# Patient Record
Sex: Female | Born: 1975 | Race: White | Hispanic: No | Smoking: Never smoker
Health system: Southern US, Community
[De-identification: ages and names within clinical notes are randomized; demographics above are authoritative.]

## PROBLEM LIST (undated history)

## (undated) ENCOUNTER — Inpatient Hospital Stay (HOSPITAL_COMMUNITY): Payer: Self-pay

## (undated) DIAGNOSIS — G43909 Migraine, unspecified, not intractable, without status migrainosus: Secondary | ICD-10-CM

## (undated) DIAGNOSIS — B019 Varicella without complication: Secondary | ICD-10-CM

## (undated) DIAGNOSIS — M5412 Radiculopathy, cervical region: Secondary | ICD-10-CM

## (undated) DIAGNOSIS — T7840XA Allergy, unspecified, initial encounter: Secondary | ICD-10-CM

## (undated) DIAGNOSIS — I10 Essential (primary) hypertension: Secondary | ICD-10-CM

## (undated) DIAGNOSIS — R32 Unspecified urinary incontinence: Secondary | ICD-10-CM

## (undated) DIAGNOSIS — N200 Calculus of kidney: Secondary | ICD-10-CM

## (undated) DIAGNOSIS — G629 Polyneuropathy, unspecified: Secondary | ICD-10-CM

## (undated) DIAGNOSIS — R030 Elevated blood-pressure reading, without diagnosis of hypertension: Secondary | ICD-10-CM

## (undated) HISTORY — DX: Essential (primary) hypertension: I10

## (undated) HISTORY — DX: Radiculopathy, cervical region: M54.12

## (undated) HISTORY — DX: Allergy, unspecified, initial encounter: T78.40XA

## (undated) HISTORY — DX: Varicella without complication: B01.9

## (undated) HISTORY — DX: Migraine, unspecified, not intractable, without status migrainosus: G43.909

## (undated) HISTORY — DX: Elevated blood-pressure reading, without diagnosis of hypertension: R03.0

## (undated) HISTORY — DX: Calculus of kidney: N20.0

## (undated) HISTORY — DX: Unspecified urinary incontinence: R32

## (undated) HISTORY — DX: Polyneuropathy, unspecified: G62.9

## (undated) HISTORY — PX: COLONOSCOPY: SHX174

## (undated) HISTORY — PX: WISDOM TOOTH EXTRACTION: SHX21

---
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
## 2001-05-27 ENCOUNTER — Encounter: Payer: Self-pay | Admitting: Urology

## 2001-05-27 ENCOUNTER — Ambulatory Visit (HOSPITAL_COMMUNITY): Admission: RE | Admit: 2001-05-27 | Discharge: 2001-05-27 | Payer: Self-pay | Admitting: Urology

## 2003-07-18 ENCOUNTER — Other Ambulatory Visit: Admission: RE | Admit: 2003-07-18 | Discharge: 2003-07-18 | Payer: Self-pay | Admitting: Obstetrics and Gynecology

## 2004-08-26 ENCOUNTER — Emergency Department (HOSPITAL_COMMUNITY): Admission: EM | Admit: 2004-08-26 | Discharge: 2004-08-26 | Payer: Self-pay | Admitting: Emergency Medicine

## 2005-02-10 ENCOUNTER — Emergency Department (HOSPITAL_COMMUNITY): Admission: EM | Admit: 2005-02-10 | Discharge: 2005-02-10 | Payer: Self-pay | Admitting: Emergency Medicine

## 2007-02-21 ENCOUNTER — Encounter: Admission: RE | Admit: 2007-02-21 | Discharge: 2007-02-21 | Payer: Self-pay | Admitting: Family Medicine

## 2007-03-09 ENCOUNTER — Encounter: Admission: RE | Admit: 2007-03-09 | Discharge: 2007-03-09 | Payer: Self-pay | Admitting: Family Medicine

## 2007-03-14 ENCOUNTER — Emergency Department (HOSPITAL_COMMUNITY): Admission: EM | Admit: 2007-03-14 | Discharge: 2007-03-15 | Payer: Self-pay | Admitting: Emergency Medicine

## 2008-10-11 ENCOUNTER — Emergency Department (HOSPITAL_BASED_OUTPATIENT_CLINIC_OR_DEPARTMENT_OTHER): Admission: EM | Admit: 2008-10-11 | Discharge: 2008-10-11 | Payer: Self-pay | Admitting: Emergency Medicine

## 2009-06-29 ENCOUNTER — Emergency Department (HOSPITAL_BASED_OUTPATIENT_CLINIC_OR_DEPARTMENT_OTHER): Admission: EM | Admit: 2009-06-29 | Discharge: 2009-06-29 | Payer: Self-pay | Admitting: Emergency Medicine

## 2009-06-29 ENCOUNTER — Ambulatory Visit: Payer: Self-pay | Admitting: Diagnostic Radiology

## 2009-07-13 ENCOUNTER — Emergency Department (HOSPITAL_BASED_OUTPATIENT_CLINIC_OR_DEPARTMENT_OTHER): Admission: EM | Admit: 2009-07-13 | Discharge: 2009-07-13 | Payer: Self-pay | Admitting: Emergency Medicine

## 2009-07-13 ENCOUNTER — Ambulatory Visit: Payer: Self-pay | Admitting: Diagnostic Radiology

## 2009-07-21 ENCOUNTER — Ambulatory Visit (HOSPITAL_BASED_OUTPATIENT_CLINIC_OR_DEPARTMENT_OTHER): Admission: RE | Admit: 2009-07-21 | Discharge: 2009-07-21 | Payer: Self-pay | Admitting: Urology

## 2009-08-19 HISTORY — PX: OTHER SURGICAL HISTORY: SHX169

## 2009-10-02 ENCOUNTER — Emergency Department (HOSPITAL_COMMUNITY): Admission: EM | Admit: 2009-10-02 | Discharge: 2009-10-02 | Payer: Self-pay | Admitting: Emergency Medicine
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
## 2010-08-19 HISTORY — PX: APPENDECTOMY: SHX54

## 2010-11-20 LAB — POCT PREGNANCY, URINE: Preg Test, Ur: NEGATIVE

## 2010-11-21 LAB — CBC
HCT: 36.9 % (ref 36.0–46.0)
Hemoglobin: 12.9 g/dL (ref 12.0–15.0)
MCHC: 35.1 g/dL (ref 30.0–36.0)
RDW: 11.8 % (ref 11.5–15.5)
WBC: 7.2 10*3/uL (ref 4.0–10.5)
WBC: 9.3 10*3/uL (ref 4.0–10.5)

## 2010-11-21 LAB — URINE MICROSCOPIC-ADD ON

## 2010-11-21 LAB — DIFFERENTIAL
Basophils Absolute: 0.1 10*3/uL (ref 0.0–0.1)
Basophils Absolute: 0.1 10*3/uL (ref 0.0–0.1)
Basophils Relative: 1 % (ref 0–1)
Eosinophils Absolute: 0.2 10*3/uL (ref 0.0–0.7)
Eosinophils Absolute: 0 10*3/uL (ref 0.0–0.7)
Eosinophils Relative: 0 % (ref 0–5)
Lymphocytes Relative: 18 % (ref 12–46)
Lymphs Abs: 2.1 10*3/uL (ref 0.7–4.0)
Lymphs Abs: 1.7 10*3/uL (ref 0.7–4.0)
Monocytes Absolute: 0.4 10*3/uL (ref 0.1–1.0)
Monocytes Absolute: 0.3 10*3/uL (ref 0.1–1.0)
Monocytes Relative: 6 % (ref 3–12)
Monocytes Relative: 4 % (ref 3–12)
Neutro Abs: 4.4 10*3/uL (ref 1.7–7.7)
Neutro Abs: 7.2 10*3/uL (ref 1.7–7.7)
Neutrophils Relative %: 77 % (ref 43–77)

## 2010-11-21 LAB — COMPREHENSIVE METABOLIC PANEL
ALT: 12 U/L (ref 0–35)
AST: 21 U/L (ref 0–37)
Albumin: 4 g/dL (ref 3.5–5.2)
Alkaline Phosphatase: 46 U/L (ref 39–117)
BUN: 17 mg/dL (ref 6–23)
CO2: 24 mEq/L (ref 19–32)
Calcium: 9 mg/dL (ref 8.4–10.5)
Chloride: 105 mEq/L (ref 96–112)
Creatinine, Ser: 0.9 mg/dL (ref 0.4–1.2)
GFR calc Af Amer: >60 mL/min (ref >60)
GFR calc non Af Amer: >60 mL/min (ref >60)
Glucose, Bld: 114 mg/dL — ABNORMAL HIGH (ref 70–99)
Potassium: 3.7 mEq/L (ref 3.5–5.1)
Sodium: 141 mEq/L (ref 135–145)
Total Bilirubin: 0.4 mg/dL (ref 0.3–1.2)
Total Protein: 7 g/dL (ref 6.0–8.3)

## 2010-11-21 LAB — URINALYSIS, ROUTINE W REFLEX MICROSCOPIC
Bilirubin Urine: NEGATIVE
Bilirubin Urine: NEGATIVE
Glucose, UA: NEGATIVE mg/dL
Glucose, UA: NEGATIVE mg/dL
Ketones, ur: NEGATIVE mg/dL
Ketones, ur: 15 mg/dL — AB
Leukocytes, UA: NEGATIVE
Protein, ur: NEGATIVE mg/dL
Specific Gravity, Urine: 1.02 (ref 1.005–1.030)
Specific Gravity, Urine: 1.027 (ref 1.005–1.030)
Urobilinogen, UA: 0.2 mg/dL (ref 0.0–1.0)
pH: 6 (ref 5.0–8.0)

## 2010-11-21 LAB — PREGNANCY, URINE: Preg Test, Ur: NEGATIVE

## 2010-11-21 LAB — BASIC METABOLIC PANEL
BUN: 15 mg/dL (ref 6–23)
CO2: 24 mEq/L (ref 19–32)
Creatinine, Ser: 0.8 mg/dL (ref 0.4–1.2)
GFR calc non Af Amer: >60 mL/min (ref >60)
Potassium: 3.9 mEq/L (ref 3.5–5.1)

## 2010-12-04 LAB — PREGNANCY, URINE: Preg Test, Ur: NEGATIVE

## 2010-12-14 ENCOUNTER — Other Ambulatory Visit: Payer: Self-pay | Admitting: Family Medicine

## 2010-12-14 ENCOUNTER — Emergency Department (INDEPENDENT_AMBULATORY_CARE_PROVIDER_SITE_OTHER): Payer: BC Managed Care – PPO

## 2010-12-14 ENCOUNTER — Emergency Department (HOSPITAL_BASED_OUTPATIENT_CLINIC_OR_DEPARTMENT_OTHER)
Admission: EM | Admit: 2010-12-14 | Discharge: 2010-12-15 | Disposition: A | Discharge status: Nursing Home (Includes ICF) | Payer: BC Managed Care – PPO | Source: Home / Self Care | Attending: Emergency Medicine | Admitting: Emergency Medicine

## 2010-12-14 DIAGNOSIS — R1013 Epigastric pain: Secondary | ICD-10-CM | POA: Insufficient documentation

## 2010-12-14 DIAGNOSIS — R109 Unspecified abdominal pain: Secondary | ICD-10-CM

## 2010-12-14 DIAGNOSIS — R110 Nausea: Secondary | ICD-10-CM

## 2010-12-14 DIAGNOSIS — K3580 Unspecified acute appendicitis: Secondary | ICD-10-CM | POA: Insufficient documentation

## 2010-12-14 DIAGNOSIS — Z87442 Personal history of urinary calculi: Secondary | ICD-10-CM

## 2010-12-14 LAB — DIFFERENTIAL
Eosinophils Relative: 0 % (ref 0–5)
Lymphs Abs: 1.5 10*3/uL (ref 0.7–4.0)
Monocytes Relative: 5 % (ref 3–12)
Neutrophils Relative %: 83 % — ABNORMAL HIGH (ref 43–77)

## 2010-12-14 LAB — COMPREHENSIVE METABOLIC PANEL
CO2: 25 mEq/L (ref 19–32)
Calcium: 9.2 mg/dL (ref 8.4–10.5)
Creatinine, Ser: 0.5 mg/dL (ref 0.4–1.2)
GFR calc non Af Amer: >60 mL/min (ref >60)
Glucose, Bld: 86 mg/dL (ref 70–99)
Total Protein: 8.1 g/dL (ref 6.0–8.3)

## 2010-12-14 LAB — CBC
MCHC: 34.4 g/dL (ref 30.0–36.0)
RBC: 4.23 MIL/uL (ref 3.87–5.11)
WBC: 13.4 10*3/uL — ABNORMAL HIGH (ref 4.0–10.5)

## 2010-12-14 LAB — URINALYSIS, ROUTINE W REFLEX MICROSCOPIC
Bilirubin Urine: NEGATIVE
Glucose, UA: NEGATIVE mg/dL
Hgb urine dipstick: NEGATIVE
Ketones, ur: 15 mg/dL — AB
Nitrite: NEGATIVE
Protein, ur: NEGATIVE mg/dL
Specific Gravity, Urine: 1.02 (ref 1.005–1.030)

## 2010-12-14 LAB — LIPASE, BLOOD: Lipase: 94 U/L (ref 23–300)

## 2010-12-14 MED ORDER — IOHEXOL 300 MG/ML  SOLN
100.0000 mL | Freq: Once | INTRAMUSCULAR | Status: AC | PRN
  Administered 2010-12-14: 100 mL via INTRAVENOUS

## 2010-12-15 ENCOUNTER — Ambulatory Visit (HOSPITAL_COMMUNITY)
Admission: EM | Admit: 2010-12-15 | Discharge: 2010-12-16 | Disposition: A | Discharge status: Home / Self Care | Payer: BC Managed Care – PPO | Attending: General Surgery | Admitting: General Surgery

## 2010-12-15 ENCOUNTER — Other Ambulatory Visit: Payer: Self-pay | Admitting: General Surgery

## 2010-12-15 DIAGNOSIS — R1031 Right lower quadrant pain: Secondary | ICD-10-CM | POA: Insufficient documentation

## 2010-12-15 DIAGNOSIS — K3580 Unspecified acute appendicitis: Secondary | ICD-10-CM | POA: Insufficient documentation

## 2010-12-15 DIAGNOSIS — R110 Nausea: Secondary | ICD-10-CM | POA: Insufficient documentation

## 2010-12-15 DIAGNOSIS — R1013 Epigastric pain: Secondary | ICD-10-CM | POA: Insufficient documentation

## 2010-12-15 DIAGNOSIS — Z87442 Personal history of urinary calculi: Secondary | ICD-10-CM | POA: Insufficient documentation

## 2010-12-17 ENCOUNTER — Other Ambulatory Visit: Payer: Self-pay

## 2010-12-19 NOTE — Op Note (Signed)
NAMEDOTTI, BUSEY            ACCOUNT NO.:  [id number]  MEDICAL RECORD NO.:  [id number]           PATIENT TYPE:  E  LOCATION:  WLED                         FACILITY:  [facility]  PHYSICIAN:  Sharlet Salina T. Finola Rosal, M.D.DATE OF BIRTH:  1976/03/21  DATE OF PROCEDURE: DATE OF DISCHARGE:  12/15/2010                              OPERATIVE REPORT   PREOPERATIVE DIAGNOSIS:  Acute appendicitis.  POSTOPERATIVE DIAGNOSIS:  Acute appendicitis.  SURGICAL PROCEDURE:  Laparoscopic appendectomy.  SURGEON:  Lorne Skeens. Reilly Molchan, M.D.  ANESTHESIA:  General.  BRIEF HISTORY:  Ms. Edmunds is a 35 year old female who presents with about 18 hours of persistent abdominal pain, initially upper abdomen and radiating somewhat towards the lower abdomen.  She has had a workup in the emergency room including a negative ultrasound of the gallbladder and a CT scan showing probable acute appendicitis.  She has elevated white count, nausea, and localized tenderness in the right lower quadrant.  I have recommended proceeding with laparoscopic appendectomy for probable acute appendicitis.  Procedure indications; risks of bleeding, infection, anesthetic complications were discussed and understood.  Following a broad-spectrum antibiotics, she was taken to the operating room for procedure.  DESCRIPTION OF OPERATION:  The patient was brought to the operating room, placed in a supine position on the operating table and general endotracheal anesthesia was induced.  Foley catheter was placed.  The abdomen was widely and sterilely prepped and draped and correct patient procedure was verified.  Local anesthesia was used to infiltrate the trocar sites.  A 1-cm incision was made in the umbilicus and dissection was carried down in the midline fascia, which was incised about 1 cm and the peritoneum entered under direct vision.  Through a mattress suture of 0 Vicryl, the Hasson trocar was placed and  pneumoperitoneum established.  Under direct vision, a 5-mm trocar was placed in the right upper quadrant and 12-mm trocar in the left lower quadrant.  Exploration showed no evidence of peritonitis or free fluid.  The liver and gallbladder appeared normal.  Intestinal loops appeared normal.  The appendix was found lying near the midline and was acutely inflamed without perforation or gangrene.  The appendix was elevated.  The mesoappendix was exposed.  It was sequentially divided with a Harmonic scalpel until the appendix was completely freed down to the tip of the cecum.  The appendix was divided at the tip of the cecum with a single firing of the 45-mm linear stapler blue load.  The staple line was intact and without bleeding.  There was no inflammation on the staple line.  The appendix was placed in an EndoCatch bag and brought out through the umbilicus.  It was then thoroughly irrigated and complete hemostasis assured.  There was no bleeding or evidence of trocar injury.  Also noted, there were normal uterus, ovaries, and tubes.  The mattress suture was secured at the umbilicus, and all CO2 evacuated and trocars removed.  Skin incisions were closed with subcuticular Monocryl and Dermabond.  Sponges and needle counts were correct.  The patient was taken to the Recovery in good condition.     Lorne Skeens. Aden Youngman, M.D.  BTH/MEDQ  D:  12/15/2010  T:  12/15/2010  Job:  [id number]  Electronically Signed by Glenna Fellows M.D. on 12/19/2010 08:16:47 AM

## 2010-12-19 NOTE — H&P (Signed)
Kristina Pierce, Kristina Pierce            ACCOUNT NO.:  [id number]  MEDICAL RECORD NO.:  [id number]           PATIENT TYPE:  E  LOCATION:  WLED                         FACILITY:  [facility]  PHYSICIAN:  Sharlet Salina T. Delmy Holdren, M.D.DATE OF BIRTH:  11-Apr-1976  DATE OF ADMISSION:  12/15/2010 DATE OF DISCHARGE:  12/15/2010                             HISTORY & PHYSICAL   CHIEF COMPLAINT:  Abdominal pain.  HISTORY:  This patient is a 35 year old female who woke early yesterday morning about 20 hours ago with epigastric abdominal pain.  She had some nausea.  She was able to go to work, but then on the way home, pain worsened and she saw her primary care physician.  There was concern of gallbladder, based on history.  Plans were made for ultrasound on Monday.  However, this evening, her pain worsened and she presented to [facility].  Their, an ultrasound was obtained of the gallbladder and liver that was normal.  Following this, a CT scan was obtained indicating early appendicitis as described below.  The patient has noted her pain has moved gradually towards the lower abdomen and is somewhat on the right.  She has had nausea without vomiting.  No fever or chills.  She has no chronic GI complaints.  Bowel movement normal. No urinary symptoms.  PAST MEDICAL HISTORY:  She has had cystoscopy for kidney stones and tonsillectomy, otherwise well.  MEDICATIONS:  None.  ALLERGIES:  None.  SOCIAL HISTORY:  Married, employed as a Runner, broadcasting/film/video.  No cigarettes or alcohol.  FAMILY HISTORY:  Noncontributory.  REVIEW OF SYSTEMS:  Very healthy, all negative.  PHYSICAL EXAMINATION:  VITAL SIGNS:  Temperature is 98.6, heart rate 78, blood pressure 115/68, respirations 16, O2 sat 100%. GENERAL:  She is a somewhat uncomfortable appearing white female in no acute stress. SKIN:  Warm, dry.  No rash or infection. HEENT:  No mass or thyromegaly.  Sclerae nonicteric.  Oropharynx clear. LYMPH NODES:  No  cervical, supraclavicular, or inguinal nodes palpable. LUNGS:  Clear without wheezing or increased work of breathing. CARDIAC:  Regular rate and rhythm.  No murmurs. ABDOMEN:  Bowel sounds are present.  There is mild diffuse tenderness, but well localized significant tenderness in the right lower quadrant with guarding, but no definite peritoneal signs.  No discernible masses or organomegaly.  EXTREMITIES:  No joint swelling or deformity. NEUROLOGIC:  Alert, oriented, motor and sensory exam is grossly normal.  LABORATORY DATA AND X-RAY:  White count elevated to 13.4 thousand, hemoglobin 13.4.  Electrolytes normal.  Urinalysis unremarkable.  CT scan of the abdomen and pelvis were reviewed.  This shows a mildly dilated appendix, which is not completely visualized with some increased enhancement of concern for early acute appendicitis.  ASSESSMENT/PLAN:  The patient's presentation is a little unusual, but she has marked tenderness in the right lower quadrant, elevated white count, nausea, and CT scan indicating possible appendicitis.  I have recommended proceeding with laparoscopic appendectomy.  She is receiving broad-spectrum antibiotics preoperatively.     Lorne Skeens. Idaly Verret, M.D.     Tory Emerald  D:  12/15/2010  T:  12/15/2010  Job:  [id number]  Electronically Signed by Glenna Fellows M.D. on 12/19/2010 08:16:41 AM

## 2011-06-03 LAB — I-STAT 8, (EC8 V) (CONVERTED LAB)
Acid-base deficit: 1
Chloride: 105
Glucose, Bld: 95
Hemoglobin: 13.3
Potassium: 3.7
Sodium: 138
TCO2: 25

## 2011-06-03 LAB — CBC
HCT: 36.7
Hemoglobin: 12.6
MCHC: 34.3
MCV: 94.1
RDW: 12.5

## 2011-06-03 LAB — POCT CARDIAC MARKERS: CKMB, poc: <1 — ABNORMAL LOW

## 2011-06-03 LAB — URINALYSIS, ROUTINE W REFLEX MICROSCOPIC
Nitrite: NEGATIVE
Specific Gravity, Urine: 1.022
Urobilinogen, UA: 0.2
pH: 7.5

## 2011-06-03 LAB — DIFFERENTIAL
Basophils Absolute: 0
Eosinophils Absolute: 0.1
Eosinophils Relative: 1
Lymphocytes Relative: 33
Monocytes Absolute: 0.3

## 2011-06-03 LAB — URINE MICROSCOPIC-ADD ON

## 2011-06-03 LAB — POCT PREGNANCY, URINE: Preg Test, Ur: NEGATIVE

## 2011-07-27 ENCOUNTER — Encounter: Payer: Self-pay | Admitting: Emergency Medicine

## 2011-07-27 ENCOUNTER — Emergency Department (HOSPITAL_BASED_OUTPATIENT_CLINIC_OR_DEPARTMENT_OTHER)
Admission: EM | Admit: 2011-07-27 | Discharge: 2011-07-27 | Disposition: A | Discharge status: Home / Self Care | Payer: BC Managed Care – PPO | Attending: Emergency Medicine | Admitting: Emergency Medicine

## 2011-07-27 DIAGNOSIS — M549 Dorsalgia, unspecified: Secondary | ICD-10-CM | POA: Insufficient documentation

## 2011-07-27 DIAGNOSIS — T148XXA Other injury of unspecified body region, initial encounter: Secondary | ICD-10-CM

## 2011-07-27 DIAGNOSIS — M542 Cervicalgia: Secondary | ICD-10-CM | POA: Insufficient documentation

## 2011-07-27 DIAGNOSIS — O2690 Pregnancy related conditions, unspecified, unspecified trimester: Secondary | ICD-10-CM | POA: Insufficient documentation

## 2011-07-27 MED ORDER — HYDROCODONE-ACETAMINOPHEN 5-325 MG PO TABS: 1.0000 | ORAL_TABLET | ORAL | Status: AC | PRN

## 2011-07-27 MED ORDER — HYDROCODONE-ACETAMINOPHEN 5-325 MG PO TABS
1.0000 | ORAL_TABLET | Freq: Once | ORAL | Status: AC
  Administered 2011-07-27: 1 via ORAL
  Filled 2011-07-27: qty 1

## 2011-07-27 NOTE — ED Provider Notes (Signed)
History     CSN: [id number] Arrival date & time: 07/27/2011  6:57 AM   First MD Initiated Contact with Patient 07/27/11 0710      Chief Complaint  Patient presents with  . Back Pain    pt reports chronic low back pain unrelieved by tylenol at home    (Consider location/radiation/quality/duration/timing/severity/associated sxs/prior treatment) HPI Comments: Patient presents with right upper back pain that radiates up her neck at times.  No specific injury.  It began on Thursday morning at approximately 4 AM.  She notes she has had this similar pain before and has been to physical therapy for approximately 4 years ago.  No prior surgeries.  She has been using Tylenol at home because she is newly pregnant at approximately 6 weeks.  She is continued to use heat and ice as well but this and unable to sleep much over the last few days due to her pain.  She does have a followup appointment with her orthopedic physician on Tuesday but is trying to get some pain relief social be able to sleep between now and then.  Patient denies any fevers, cough, shortness of breath.  She has no low back pain despite what is present in the nursing notes.  Patient is a 35 y.o. female presenting with back pain. The history is provided by the patient. No language interpreter was used.  Back Pain  This is a recurrent problem. The current episode started more than 2 days ago. The problem occurs constantly. The problem has been gradually worsening. The pain is associated with no known injury. The pain is present in the thoracic spine. The quality of the pain is described as shooting. The pain is moderate. The symptoms are aggravated by certain positions. Pertinent negatives include no chest pain, no fever, no numbness, no weight loss, no headaches, no abdominal pain, no abdominal swelling, no bowel incontinence, no perianal numbness, no bladder incontinence, no dysuria, no pelvic pain, no leg pain, no paresthesias, no paresis,  no tingling and no weakness. She has tried ice, bed rest and heat for the symptoms. The treatment provided mild relief.    Past Medical History  Diagnosis Date  . Kidney stone     Past Surgical History  Procedure Date  . Appendectomy     History reviewed. No pertinent family history.  History  Substance Use Topics  . Smoking status: Never Smoker   . Smokeless tobacco: Not on file  . Alcohol Use: No    OB History    Grav Para Term Preterm Abortions TAB SAB Ect Mult Living   1               Review of Systems  Constitutional: Negative.  Negative for fever, chills and weight loss.  HENT: Negative.   Eyes: Negative.  Negative for discharge and redness.  Respiratory: Negative.  Negative for cough and shortness of breath.   Cardiovascular: Negative.  Negative for chest pain.  Gastrointestinal: Negative.  Negative for nausea, vomiting, abdominal pain, diarrhea and bowel incontinence.  Genitourinary: Negative.  Negative for bladder incontinence, dysuria, vaginal discharge and pelvic pain.  Musculoskeletal: Positive for back pain.  Skin: Negative.  Negative for color change and rash.  Neurological: Negative.  Negative for tingling, syncope, weakness, numbness, headaches and paresthesias.  Hematological: Negative.  Negative for adenopathy.  Psychiatric/Behavioral: Negative.  Negative for confusion.  All other systems reviewed and are negative.    Allergies  Review of patient's allergies indicates no known allergies.  Home Medications   Current Outpatient Rx  Name Route Sig Dispense Refill  . PRENATAL 27-0.8 MG PO TABS Oral Take 1 tablet by mouth daily.        BP 115/60  Pulse 99  Temp 98 F (36.7 C)  Resp 22  Wt 138 lb (62.596 kg)  SpO2 100%  LMP 06/15/2011  Physical Exam  Constitutional: She is oriented to person, place, and time. She appears well-developed and well-nourished.  Non-toxic appearance. She does not have a sickly appearance.  HENT:  Head:  Normocephalic and atraumatic.  Eyes: Conjunctivae, EOM and lids are normal. Pupils are equal, round, and reactive to light. No scleral icterus.  Neck: Trachea normal and normal range of motion. Neck supple.  Cardiovascular: Normal rate, regular rhythm and normal heart sounds.   Pulmonary/Chest: Effort normal and breath sounds normal.  Abdominal: Soft. Normal appearance. There is no tenderness. There is no rebound, no guarding and no CVA tenderness.  Musculoskeletal: Normal range of motion.       No focal C-spine or upper T-spine tenderness on exam.  No step-offs noted.  Patient has mild tenderness to palpation over her right upper trapezius and upper right paraspinal cervical muscles.  Neurological: She is alert and oriented to person, place, and time. She has normal strength.  Skin: Skin is warm, dry and intact. No rash noted.  Psychiatric: She has a normal mood and affect. Her behavior is normal. Judgment and thought content normal.    ED Course  Procedures (including critical care time)  Labs Reviewed - No data to display No results found.   No diagnosis found.    MDM  Patient with what appears to be musculoskeletal upper back pain.  She has no bony tenderness no signs of infection.  Her pain is limited to her upper trapezius and neck muscles and is not low back pain on my discussion with the patient.  I have discussed with the patient the risks and benefits of treating her with narcotic pain medicine given her pregnancy at this point in time.  She understands the importance of minimal dosing of narcotics and also has followup with orthopedic physician on Tuesday.  She will be using extra Tylenol at baseline to a maximum of 4 g per day in addition to heat, ice and Vicodin if needed.        Nat Christen, MD 07/27/11 [phone]

## 2011-07-27 NOTE — ED Notes (Signed)
Pt reports she is [redacted] weeks pregnant with increased low back pain

## 2011-08-14 LAB — OB RESULTS CONSOLE ABO/RH: RH Type: POSITIVE

## 2011-08-14 LAB — OB RESULTS CONSOLE ANTIBODY SCREEN: Antibody Screen: NEGATIVE

## 2011-08-14 LAB — OB RESULTS CONSOLE GBS: GBS: NEGATIVE

## 2011-08-14 LAB — OB RESULTS CONSOLE RPR: RPR: NONREACTIVE

## 2011-08-20 NOTE — L&D Delivery Note (Signed)
Delivery Note At 6:00 PM a viable female was delivered via Vaginal, Spontaneous Delivery (Presentation: Left Occiput Anterior).  APGAR: 8, 9; weight 7 lb 5.5 oz (3331 g).   Placenta status: Intact, Spontaneous.  Cord: 3 vessels with the following complications: None.  Cord pH: not obtained  Anesthesia: Epidural  Episiotomy: None Lacerations: 2nd degree;Perineal Suture Repair: 3.0 chromic Est. Blood Loss (mL): 300  Mom to postpartum.  Baby to nursery-stable.  Suann Klier L 03/16/2012, 6:27 PM

## 2011-12-11 ENCOUNTER — Encounter (HOSPITAL_COMMUNITY): Payer: Self-pay | Admitting: *Deleted

## 2011-12-11 ENCOUNTER — Inpatient Hospital Stay (HOSPITAL_COMMUNITY)
Admission: AD | Admit: 2011-12-11 | Discharge: 2011-12-11 | Disposition: A | Discharge status: Home / Self Care | Payer: BC Managed Care – PPO | Source: Ambulatory Visit | Attending: Obstetrics and Gynecology | Admitting: Obstetrics and Gynecology

## 2011-12-11 DIAGNOSIS — R109 Unspecified abdominal pain: Secondary | ICD-10-CM

## 2011-12-11 DIAGNOSIS — O26899 Other specified pregnancy related conditions, unspecified trimester: Secondary | ICD-10-CM

## 2011-12-11 DIAGNOSIS — Z87442 Personal history of urinary calculi: Secondary | ICD-10-CM

## 2011-12-11 DIAGNOSIS — O99891 Other specified diseases and conditions complicating pregnancy: Secondary | ICD-10-CM | POA: Insufficient documentation

## 2011-12-11 LAB — URINE MICROSCOPIC-ADD ON

## 2011-12-11 LAB — URINALYSIS, ROUTINE W REFLEX MICROSCOPIC
Glucose, UA: NEGATIVE mg/dL
Ketones, ur: NEGATIVE mg/dL
Leukocytes, UA: NEGATIVE
Nitrite: NEGATIVE
Protein, ur: NEGATIVE mg/dL
Specific Gravity, Urine: <1.005 — ABNORMAL LOW (ref 1.005–1.030)
pH: 7.5 (ref 5.0–8.0)

## 2011-12-11 MED ORDER — HYDROCODONE-ACETAMINOPHEN 5-500 MG PO TABS: 1.0000 | ORAL_TABLET | Freq: Four times a day (QID) | ORAL | Status: AC | PRN

## 2011-12-11 MED ORDER — TAMSULOSIN HCL 0.4 MG PO CAPS: 0.4000 mg | ORAL_CAPSULE | Freq: Every day | ORAL | Status: DC

## 2011-12-11 NOTE — MAU Note (Signed)
States she has felt cramping since last night.  Tried change of positions, hydration. Was finally able to sleep for a few hours. When she woke up started cramping again. States it is constant in her lower abdomen, but varies in intensity. States she has had UTI's and kidney stones, but this does not feel quite like that.

## 2011-12-11 NOTE — MAU Provider Note (Signed)
History     CSN: [id number]  Arrival date and time: 12/11/11 7673   First Provider Initiated Contact with Patient 12/11/11 0830      Chief Complaint  Patient presents with  . Abdominal Cramping   HPI Kristina Pierce 36 y.o. [redacted] week gestation.  Comes to MAU with lower abdominal pressure since 1 am.  Did sleep until 6 am but pressure still the same.  Feels like she is not getting empty when urinating.  Hx of kidney stones.     OB History    Grav Para Term Preterm Abortions TAB SAB Ect Mult Living   1 0        0      Past Medical History  Diagnosis Date  . Kidney stone     Past Surgical History  Procedure Date  . Appendectomy   . Wisdom tooth extraction   . Tonsillectomy     History reviewed. No pertinent family history.  History  Substance Use Topics  . Smoking status: Never Smoker   . Smokeless tobacco: Never Used  . Alcohol Use: No    Allergies: No Known Allergies  Prescriptions prior to admission  Medication Sig Dispense Refill  . Prenatal Vit-Fe Fumarate-FA (MULTIVITAMIN-PRENATAL) 27-0.8 MG TABS Take 1 tablet by mouth daily.          Review of Systems  Gastrointestinal: Positive for abdominal pain. Negative for nausea, vomiting, diarrhea and constipation.  Genitourinary: Negative for dysuria.       Lower abdominal pressure   Physical Exam   Blood pressure 112/47, pulse 83, temperature 98.6 F (37 C), temperature source Oral, resp. rate 16, height 5' 6.5" (1.689 m), weight 141 lb 12.8 oz (64.32 kg), last menstrual period 06/15/2011.  Physical Exam  Nursing note and vitals reviewed. Constitutional: She is oriented to person, place, and time. She appears well-developed and well-nourished.  HENT:  Head: Normocephalic.  Eyes: EOM are normal.  Neck: Neck supple.  GI: Soft. There is tenderness. There is no rebound and no guarding.       FHT on monitor stable No contractions seen on monitor No contractions palpated  Genitourinary:       Speculum  exam - no blood Bimanual - cervix closed and firm  Musculoskeletal: Normal range of motion.  Neurological: She is alert and oriented to person, place, and time.  Skin: Skin is warm and dry.  Psychiatric: She has a normal mood and affect.    MAU Course  Procedures Results for orders placed during the hospital encounter of 12/11/11 (from the past 24 hour(s))  URINALYSIS, ROUTINE W REFLEX MICROSCOPIC     Status: Abnormal   Collection Time   12/11/11  8:26 AM      Component Value Range   Color, Urine YELLOW  YELLOW    APPearance CLOUDY (*) CLEAR    Specific Gravity, Urine 1.010  1.005 - 1.030    pH 7.5  5.0 - 8.0    Glucose, UA NEGATIVE  NEGATIVE (mg/dL)   Hgb urine dipstick LARGE (*) NEGATIVE    Bilirubin Urine NEGATIVE  NEGATIVE    Ketones, ur NEGATIVE  NEGATIVE (mg/dL)   Protein, ur NEGATIVE  NEGATIVE (mg/dL)   Urobilinogen, UA 0.2  0.0 - 1.0 (mg/dL)   Nitrite NEGATIVE  NEGATIVE    Leukocytes, UA TRACE (*) NEGATIVE   URINE MICROSCOPIC-ADD ON     Status: Abnormal   Collection Time   12/11/11  8:26 AM      Component Value  Range   Squamous Epithelial / LPF MANY (*) RARE    WBC, UA 7-10  <3 (WBC/hpf)   RBC / HPF TOO NUMEROUS TO COUNT  <3 (RBC/hpf)   Bacteria, UA FEW (*) RARE   URINALYSIS, ROUTINE W REFLEX MICROSCOPIC     Status: Abnormal   Collection Time   12/11/11 10:01 AM      Component Value Range   Color, Urine YELLOW  YELLOW    APPearance CLEAR  CLEAR    Specific Gravity, Urine <1.005 (*) 1.005 - 1.030    pH 7.5  5.0 - 8.0    Glucose, UA NEGATIVE  NEGATIVE (mg/dL)   Hgb urine dipstick LARGE (*) NEGATIVE    Bilirubin Urine NEGATIVE  NEGATIVE    Ketones, ur NEGATIVE  NEGATIVE (mg/dL)   Protein, ur NEGATIVE  NEGATIVE (mg/dL)   Urobilinogen, UA 0.2  0.0 - 1.0 (mg/dL)   Nitrite NEGATIVE  NEGATIVE    Leukocytes, UA NEGATIVE  NEGATIVE   URINE MICROSCOPIC-ADD ON     Status: Abnormal   Collection Time   12/11/11 10:01 AM      Component Value Range   Squamous Epithelial /  LPF FEW (*) RARE    WBC, UA 0-2  <3 (WBC/hpf)   RBC / HPF 7-10  <3 (RBC/hpf)    [id number]  Consult with Dr. Marcelle Overlie re: plan of care - will do cath urine and repeat UA 1035  Reviewed plan of care with Dr. Marcelle Overlie  Assessment and Plan  Abdominal pain in pregnancy Likely related to kidney stones  Plan Urine culture pending Push PO fluids  Strain all urine Use pain medication as needed Expect the office to call you to schedule follow up Return if you have any vaginal bleeding or severe pain.  Larua Collier 12/11/2011, 8:41 AM

## 2011-12-11 NOTE — Discharge Instructions (Signed)
Push PO fluids  Strain all urine Use pain medication as needed Expect the office to call you to schedule follow up Return if you have any vaginal bleeding or severe pain.

## 2011-12-12 LAB — URINE CULTURE: Colony Count: NO GROWTH

## 2012-01-05 ENCOUNTER — Encounter (HOSPITAL_COMMUNITY): Payer: Self-pay | Admitting: *Deleted

## 2012-01-05 ENCOUNTER — Inpatient Hospital Stay (HOSPITAL_COMMUNITY)
Admission: AD | Admit: 2012-01-05 | Discharge: 2012-01-05 | Disposition: A | Discharge status: Home / Self Care | Payer: BC Managed Care – PPO | Source: Ambulatory Visit | Attending: Obstetrics and Gynecology | Admitting: Obstetrics and Gynecology

## 2012-01-05 DIAGNOSIS — O99891 Other specified diseases and conditions complicating pregnancy: Secondary | ICD-10-CM | POA: Insufficient documentation

## 2012-01-05 DIAGNOSIS — R109 Unspecified abdominal pain: Secondary | ICD-10-CM | POA: Insufficient documentation

## 2012-01-05 DIAGNOSIS — R300 Dysuria: Secondary | ICD-10-CM | POA: Insufficient documentation

## 2012-01-05 DIAGNOSIS — R319 Hematuria, unspecified: Secondary | ICD-10-CM | POA: Insufficient documentation

## 2012-01-05 DIAGNOSIS — Z331 Pregnant state, incidental: Secondary | ICD-10-CM

## 2012-01-05 DIAGNOSIS — R1084 Generalized abdominal pain: Secondary | ICD-10-CM

## 2012-01-05 LAB — URINALYSIS, ROUTINE W REFLEX MICROSCOPIC
Bilirubin Urine: NEGATIVE
Ketones, ur: NEGATIVE mg/dL
Nitrite: NEGATIVE
Protein, ur: NEGATIVE mg/dL

## 2012-01-05 LAB — WET PREP, GENITAL
Clue Cells Wet Prep HPF POC: NEGATIVE — AB
Trich, Wet Prep: NONE SEEN

## 2012-01-05 LAB — URINE MICROSCOPIC-ADD ON

## 2012-01-05 MED ORDER — TERBUTALINE SULFATE 1 MG/ML IJ SOLN
0.2500 mg | Freq: Once | INTRAMUSCULAR | Status: AC
  Administered 2012-01-05: 0.25 mg via SUBCUTANEOUS
  Filled 2012-01-05: qty 1

## 2012-01-05 NOTE — Discharge Instructions (Signed)
Preventing Preterm Labor Preterm labor is when a pregnant woman has contractions that cause the cervix to open, shorten, and thin before 37 weeks of pregnancy. You will have regular contractions (tightening) 2 to 3 minutes apart. This usually causes discomfort or pain. HOME CARE  Eat a healthy diet.   Take your vitamins as told by your doctor.   Drink enough fluids to keep your pee (urine) clear or pale yellow every day.  Get rest and sleep.   Preterm Labor Preterm labor is when labor starts at less than 37 weeks of pregnancy. The normal length of a pregnancy is 39 to 41 weeks. CAUSES Often, there is no identifiable underlying cause as to why a woman goes into preterm labor. However, one of the most common known causes of preterm labor is infection. Infections of the uterus, cervix, vagina, amniotic sac, bladder, kidney, or even the lungs (pneumonia) can cause labor to start. Other causes of preterm labor include: Urogenital infections, such as yeast infections and bacterial vaginosis.  Uterine abnormalities (uterine shape, uterine septum, fibroids, bleeding from the placenta).  A cervix that has been operated on and opens prematurely.  Malformations in the baby.  Multiple gestations (twins, triplets, and so on).  Breakage of the amniotic sac.  Additional risk factors for preterm labor include: Previous history of preterm labor.  Premature rupture of membranes (PROM).  A placenta that covers the opening of the cervix (placenta previa).  A placenta that separates from the uterus (placenta abruption).  A cervix that is too weak to hold the baby in the uterus (incompetence cervix).  Having too much fluid in the amniotic sac (polyhydramnios).  Taking illegal drugs or smoking while pregnant.  Not gaining enough weight while pregnant.  Women younger than 12 and older than 36 years old.  Low socioeconomic status.  African-American ethnicity.  SYMPTOMS Signs and symptoms of preterm labor  include: Menstrual-like cramps.  Contractions that are 30 to 70 seconds apart, become very regular, closer together, and are more intense and painful.  Contractions that start on the top of the uterus and spread down to the lower abdomen and back.  A sense of increased pelvic pressure or back pain.  A watery or bloody discharge that comes from the vagina.  DIAGNOSIS  A diagnosis can be confirmed by: A vaginal exam.  An ultrasound of the cervix.  Sampling (swabbing) cervico-vaginal secretions. These samples can be tested for the presence of fetal fibronectin. This is a protein found in cervical discharge which is associated with preterm labor.  Fetal monitoring.  TREATMENT  Depending on the length of the pregnancy and other circumstances, a caregiver may suggest bed rest. If necessary, there are medicines that can be given to stop contractions and to quicken fetal lung maturity. If labor happens before 34 weeks of pregnancy, a prolonged hospital stay may be recommended. Treatment depends on the condition of both the mother and baby. PREVENTION There are some things a mother can do to lower the risk of preterm labor in future pregnancies. A woman can:  Stop smoking.  Maintain healthy weight gain and avoid chemicals and drugs that are not necessary.  Be watchful for any type of infection.  Inform her caregiver if she has a known history of preterm labor.  Document Released: 10/26/2003 Document Revised: 07/25/2011 Document Reviewed: 11/30/2010  [facility] Patient Information 2012 [place], [place].  Do not have sex if you are at high risk for preterm labor.   Follow your doctor's advice about  activity, medicines, and tests.   Avoid stress.   Avoid hard labor or exercise that lasts for a long time.   Do not smoke.  GET HELP RIGHT AWAY IF:   You are having contractions.   You have belly (abdominal) pain.   You have bleeding from your vagina.   You have pain when you pee (urinate).    You have abnormal discharge from your vagina.   You have a temperature by mouth above 102 F (38.9 C).  MAKE SURE YOU:  Understand these instructions.   Will watch your condition.   Will get help if you are not doing well or get worse.  Document Released: 11/01/2008 Document Revised: 07/25/2011 Document Reviewed: 11/01/2008 [facility] Patient Information 2012 [place], [place].

## 2012-01-05 NOTE — MAU Note (Signed)
Woke up 8:30 this morning went to the bathroom had pink on tissue when wiped denies burning with urination, had cramping which was mild 28 weeks

## 2012-01-05 NOTE — MAU Provider Note (Signed)
Chief Complaint:  Dysuria and Abdominal Cramping    First Provider Initiated Contact with Patient 01/05/12 1200      Laurell Bensen is  36 y.o. G1P0.  Patient's last menstrual period was 06/15/2011.. [redacted]w[redacted]d She presents complaining of Dysuria and Abdominal Cramping .  Obstetrical/Gynecological History: OB History    Grav Para Term Preterm Abortions TAB SAB Ect Mult Living   1 0        0      Past Medical History: Past Medical History  Diagnosis Date  . Kidney stone     Past Surgical History: Past Surgical History  Procedure Date  . Appendectomy   . Wisdom tooth extraction   . Tonsillectomy     Family History: History reviewed. No pertinent family history.  Social History: History  Substance Use Topics  . Smoking status: Never Smoker   . Smokeless tobacco: Never Used  . Alcohol Use: No    Allergies:  Allergies  Allergen Reactions  . Chlorhexidine Other (See Comments)    Rash and itching    Prescriptions prior to admission  Medication Sig Dispense Refill  . acetaminophen (TYLENOL) 325 MG tablet Take 650 mg by mouth every 6 (six) hours as needed. headache      . calcium carbonate (TUMS - DOSED IN MG ELEMENTAL CALCIUM) 500 MG chewable tablet Chew 1 tablet by mouth daily. heartburn      . diphenhydrAMINE (BENADRYL) 25 mg capsule Take 25 mg by mouth every 6 (six) hours as needed. allergies      . famotidine (PEPCID) 20 MG tablet Take 20 mg by mouth 2 (two) times daily. heartburn      . Prenatal Vit-Fe Fumarate-FA (MULTIVITAMIN-PRENATAL) 27-0.8 MG TABS Take 1 tablet by mouth daily.          Review of Systems - History obtained from the patient Breast ROS: negative for breast lumps Respiratory ROS: no cough, shortness of breath, or wheezing Cardiovascular ROS: no chest pain or dyspnea on exertion Gastrointestinal ROS: no abdominal pain, change in bowel habits, or black or bloody stools occasional "mild cramping" Genito-Urinary ROS: positive for - dysuria and  hematuria negative for - vulvar/vaginal symptoms  Physical Exam   Blood pressure 114/66, pulse 83, temperature 97.6 F (36.4 C), temperature source Oral, resp. rate 16, weight 147 lb 9.6 oz (66.951 kg), last menstrual period 06/15/2011.  General: General appearance - alert, well appearing, and in no distress and oriented to person, place, and time Mental status - alert, oriented to person, place, and time, normal mood, behavior, speech, dress, motor activity, and thought processes, affect appropriate to mood Abdomen - gravid, non tender Focused Gynecological Exam: VULVA: normal appearing vulva with no masses, tenderness or lesions, VAGINA: creamy white discharge noted in vault, no bleeding or evidence of past bleeding in vault, CERVIX: normal appearing cervix without discharge or lesions, closed/thick/post FHR: 155, mod variability, +accels no decels, Cat I tracing for gestational age Toco: Initially Q 3-4 mins x 20 mins with spontaneous resolve. Contractions began again after speculum exam and SVE.  Labs: Recent Results (from the past 24 hour(s))  URINALYSIS, ROUTINE W REFLEX MICROSCOPIC   Collection Time   01/05/12 11:05 AM      Component Value Range   Color, Urine YELLOW  YELLOW    APPearance CLEAR  CLEAR    Specific Gravity, Urine 1.015  1.005 - 1.030    pH 7.5  5.0 - 8.0    Glucose, UA NEGATIVE  NEGATIVE (mg/dL)  Hgb urine dipstick MODERATE (*) NEGATIVE    Bilirubin Urine NEGATIVE  NEGATIVE    Ketones, ur NEGATIVE  NEGATIVE (mg/dL)   Protein, ur NEGATIVE  NEGATIVE (mg/dL)   Urobilinogen, UA 0.2  0.0 - 1.0 (mg/dL)   Nitrite NEGATIVE  NEGATIVE    Leukocytes, UA SMALL (*) NEGATIVE   URINE MICROSCOPIC-ADD ON   Collection Time   01/05/12 11:05 AM      Component Value Range   Squamous Epithelial / LPF FEW (*) RARE    WBC, UA 0-2  <3 (WBC/hpf)   RBC / HPF 7-10  <3 (RBC/hpf)   Bacteria, UA FEW (*) RARE   MD Consult: Discussed with Dr. Marcelle Overlie. Terb orders received. Urine culture  ordered  ED Course: Contractions resolved completely after 0.25mg  of Terb SQ  Assessment: 1. Hematuria   2. Pregnant state, incidental     Plan: Discharge home Urine Culture pending PTL precautions reviewed  Wray Goehring E. 01/05/2012,12:09 PM

## 2012-01-07 LAB — URINE CULTURE: Culture  Setup Time: 201305192018

## 2012-01-12 ENCOUNTER — Inpatient Hospital Stay (HOSPITAL_COMMUNITY)
Admission: AD | Admit: 2012-01-12 | Discharge: 2012-01-16 | DRG: 886 | Disposition: A | Discharge status: Home / Self Care | Payer: BC Managed Care – PPO | Source: Ambulatory Visit | Attending: Obstetrics and Gynecology | Admitting: Obstetrics and Gynecology

## 2012-01-12 DIAGNOSIS — N23 Unspecified renal colic: Secondary | ICD-10-CM | POA: Diagnosis present

## 2012-01-12 DIAGNOSIS — R109 Unspecified abdominal pain: Secondary | ICD-10-CM | POA: Diagnosis present

## 2012-01-12 DIAGNOSIS — O99891 Other specified diseases and conditions complicating pregnancy: Principal | ICD-10-CM | POA: Diagnosis present

## 2012-01-12 DIAGNOSIS — O4700 False labor before 37 completed weeks of gestation, unspecified trimester: Secondary | ICD-10-CM | POA: Diagnosis present

## 2012-01-12 DIAGNOSIS — N211 Calculus in urethra: Secondary | ICD-10-CM

## 2012-01-12 DIAGNOSIS — N200 Calculus of kidney: Secondary | ICD-10-CM | POA: Diagnosis present

## 2012-01-12 NOTE — MAU Note (Signed)
Pt reports "i have a lot of pain in my bladder area and where the urine comes out.". Pain has been constant x 2 hours.

## 2012-01-13 ENCOUNTER — Inpatient Hospital Stay (HOSPITAL_COMMUNITY): Payer: BC Managed Care – PPO

## 2012-01-13 ENCOUNTER — Encounter (HOSPITAL_COMMUNITY): Payer: Self-pay | Admitting: Advanced Practice Midwife

## 2012-01-13 LAB — URINALYSIS, ROUTINE W REFLEX MICROSCOPIC
Glucose, UA: NEGATIVE mg/dL
Ketones, ur: NEGATIVE mg/dL
Leukocytes, UA: NEGATIVE
Nitrite: NEGATIVE
Specific Gravity, Urine: 1.01 (ref 1.005–1.030)
pH: 7.5 (ref 5.0–8.0)

## 2012-01-13 LAB — URINE MICROSCOPIC-ADD ON

## 2012-01-13 MED ORDER — BUTORPHANOL TARTRATE 2 MG/ML IJ SOLN
1.0000 mg | Freq: Once | INTRAMUSCULAR | Status: AC
  Administered 2012-01-13: 1 mg via INTRAVENOUS
  Filled 2012-01-13: qty 1

## 2012-01-13 MED ORDER — BETAMETHASONE SOD PHOS & ACET 6 (3-3) MG/ML IJ SUSP
12.0000 mg | INTRAMUSCULAR | Status: AC
  Administered 2012-01-13 – 2012-01-14 (×2): 12 mg via INTRAMUSCULAR
  Filled 2012-01-13 (×2): qty 2

## 2012-01-13 MED ORDER — LACTATED RINGERS IV SOLN
INTRAVENOUS | Status: DC
  Administered 2012-01-13 – 2012-01-14 (×5): via INTRAVENOUS

## 2012-01-13 MED ORDER — DOCUSATE SODIUM 100 MG PO CAPS
100.0000 mg | ORAL_CAPSULE | Freq: Every day | ORAL | Status: DC
  Administered 2012-01-13 – 2012-01-14 (×2): 100 mg via ORAL
  Filled 2012-01-13 (×2): qty 1

## 2012-01-13 MED ORDER — SODIUM CHLORIDE 0.9 % IJ SOLN
9.0000 mL | INTRAMUSCULAR | Status: DC | PRN
  Administered 2012-01-16: 9 mL via INTRAVENOUS

## 2012-01-13 MED ORDER — ZOLPIDEM TARTRATE 10 MG PO TABS: 10.0000 mg | ORAL_TABLET | Freq: Every evening | ORAL | Status: DC | PRN

## 2012-01-13 MED ORDER — FAMOTIDINE 20 MG PO TABS
20.0000 mg | ORAL_TABLET | Freq: Two times a day (BID) | ORAL | Status: DC
  Administered 2012-01-14 – 2012-01-16 (×5): 20 mg via ORAL
  Filled 2012-01-13 (×5): qty 1

## 2012-01-13 MED ORDER — DIPHENHYDRAMINE HCL 50 MG/ML IJ SOLN: 12.5000 mg | Freq: Four times a day (QID) | INTRAMUSCULAR | Status: DC | PRN

## 2012-01-13 MED ORDER — MAGNESIUM SULFATE BOLUS VIA INFUSION
4.0000 g | Freq: Once | INTRAVENOUS | Status: AC
  Administered 2012-01-13: 4 g via INTRAVENOUS
  Filled 2012-01-13: qty 500

## 2012-01-13 MED ORDER — MORPHINE SULFATE 4 MG/ML IJ SOLN
4.0000 mg | Freq: Once | INTRAMUSCULAR | Status: AC
  Administered 2012-01-13: 4 mg via INTRAVENOUS
  Filled 2012-01-13: qty 1

## 2012-01-13 MED ORDER — ACETAMINOPHEN 325 MG PO TABS: 650.0000 mg | ORAL_TABLET | ORAL | Status: DC | PRN

## 2012-01-13 MED ORDER — OXYCODONE-ACETAMINOPHEN 5-325 MG PO TABS: 2.0000 | ORAL_TABLET | ORAL | Status: DC | PRN

## 2012-01-13 MED ORDER — CALCIUM CARBONATE ANTACID 500 MG PO CHEW: 2.0000 | CHEWABLE_TABLET | ORAL | Status: DC | PRN

## 2012-01-13 MED ORDER — DIPHENHYDRAMINE HCL 12.5 MG/5ML PO ELIX
12.5000 mg | ORAL_SOLUTION | Freq: Four times a day (QID) | ORAL | Status: DC | PRN
  Filled 2012-01-13: qty 5

## 2012-01-13 MED ORDER — DEXTROSE 5 % IV SOLN
1.0000 g | Freq: Two times a day (BID) | INTRAVENOUS | Status: DC
  Administered 2012-01-13 – 2012-01-16 (×7): 1 g via INTRAVENOUS
  Filled 2012-01-13 (×7): qty 10

## 2012-01-13 MED ORDER — HYDROCODONE-ACETAMINOPHEN 5-325 MG PO TABS
2.0000 | ORAL_TABLET | ORAL | Status: DC | PRN
  Administered 2012-01-13 – 2012-01-14 (×3): 2 via ORAL
  Filled 2012-01-13 (×3): qty 2

## 2012-01-13 MED ORDER — ONDANSETRON HCL 4 MG/2ML IJ SOLN
4.0000 mg | Freq: Four times a day (QID) | INTRAMUSCULAR | Status: DC | PRN
  Administered 2012-01-13 – 2012-01-14 (×2): 4 mg via INTRAVENOUS
  Filled 2012-01-13 (×2): qty 2

## 2012-01-13 MED ORDER — NALOXONE HCL 0.4 MG/ML IJ SOLN: 0.4000 mg | INTRAMUSCULAR | Status: DC | PRN

## 2012-01-13 MED ORDER — PRENATAL MULTIVITAMIN CH
1.0000 | ORAL_TABLET | Freq: Every day | ORAL | Status: DC
  Administered 2012-01-13 – 2012-01-15 (×3): 1 via ORAL
  Filled 2012-01-13 (×4): qty 1

## 2012-01-13 MED ORDER — MAGNESIUM SULFATE 40 G IN LACTATED RINGERS - SIMPLE
3.0000 g/h | INTRAVENOUS | Status: DC
  Administered 2012-01-13: 2 g/h via INTRAVENOUS
  Administered 2012-01-14: 3 g/h via INTRAVENOUS
  Filled 2012-01-13 (×2): qty 500

## 2012-01-13 MED ORDER — HYDROMORPHONE 0.3 MG/ML IV SOLN
INTRAVENOUS | Status: DC
  Administered 2012-01-13: 06:00:00 via INTRAVENOUS
  Filled 2012-01-13: qty 25

## 2012-01-13 MED ORDER — LACTATED RINGERS IV BOLUS (SEPSIS)
1000.0000 mL | Freq: Once | INTRAVENOUS | Status: AC
  Administered 2012-01-13: 1000 mL via INTRAVENOUS

## 2012-01-13 NOTE — Progress Notes (Signed)
UCs q2 min FHT reactive  A: Preterm UCs  P: D/W patient and husband-will begin magnesium sulfate tocolysis     BMTZ     U/S as ordered.

## 2012-01-13 NOTE — Progress Notes (Signed)
Pt arouses easily

## 2012-01-13 NOTE — MAU Provider Note (Signed)
Kristina McGinnis36 y.o.G1P0 @[redacted]w[redacted]d  by LMP Chief Complaint  Patient presents with  . Abdominal Pain     First Provider Initiated Contact with Patient 01/13/12 0055      SUBJECTIVE  HPI: Pt reports: Severe dysuria, bilat low abd/groin pain and bilat flank pain starting ~2100 last night that has gradually worsened. She also reports frequency x 3 hours accompanied by feeling of incomplete voiding. She states she only dribbles urine when she tries to void. She has a Hx of Kidney stones and states this feels the same. She denies fever, chills, contractions, VB, LOF. Pos FM. Urine culture 01/05/12 40,000 mixed. No ABX.  Urethral pain 9/10.   Past Medical History  Diagnosis Date  . Kidney stone    Past Surgical History  Procedure Date  . Appendectomy   . Wisdom tooth extraction   . Tonsillectomy    History   Social History  . Marital Status: Married    Spouse Name: N/A    Number of Children: N/A  . Years of Education: N/A   Occupational History  . Not on file.   Social History Main Topics  . Smoking status: Never Smoker   . Smokeless tobacco: Never Used  . Alcohol Use: No  . Drug Use: No  . Sexually Active: Yes   Other Topics Concern  . Not on file   Social History Narrative  . No narrative on file   No current facility-administered medications on file prior to encounter.   Current Outpatient Prescriptions on File Prior to Encounter  Medication Sig Dispense Refill  . acetaminophen (TYLENOL) 325 MG tablet Take 650 mg by mouth every 6 (six) hours as needed. headache      . calcium carbonate (TUMS - DOSED IN MG ELEMENTAL CALCIUM) 500 MG chewable tablet Chew 1 tablet by mouth daily. heartburn      . diphenhydrAMINE (BENADRYL) 25 mg capsule Take 25 mg by mouth every 6 (six) hours as needed. allergies      . famotidine (PEPCID) 20 MG tablet Take 20 mg by mouth 2 (two) times daily. heartburn      . Prenatal Vit-Fe Fumarate-FA (MULTIVITAMIN-PRENATAL) 27-0.8 MG TABS Take 1 tablet  by mouth daily.         Allergies  Allergen Reactions  . Chlorhexidine Other (See Comments)    Rash and itching    ROS: Pertinent items in HPI  OBJECTIVE Blood pressure 125/65, pulse 84, temperature 98.4 F (36.9 C), temperature source Oral, resp. rate 20, height 5\' 6"  (1.676 m), weight 67.586 kg (149 lb), last menstrual period 06/15/2011, SpO2 99.00%.  GENERAL: Well-developed, slender female in moderate distress, shaking.  HEENT: Normocephalic HEART: normal rate RESP: normal effort ABDOMEN: Soft, nontender, gravid, S=D EXTREMITIES: Nontender, no edema NEURO: Alert and oriented SPECULUM EXAM: deferred  FHR: 140's moderate variability, pos accels, no decels Toco: Few painless UC's w/ frequent UI Dilation: Closed Effacement (%): Thick Exam by:: Baker Kogler,CNM   LAB RESULTS Results for orders placed during the hospital encounter of 01/12/12 (from the past 24 hour(s))  URINALYSIS, ROUTINE W REFLEX MICROSCOPIC     Status: Abnormal   Collection Time   01/12/12 11:50 PM      Component Value Range   Color, Urine YELLOW  YELLOW    APPearance CLOUDY (*) CLEAR    Specific Gravity, Urine 1.010  1.005 - 1.030    pH 7.5  5.0 - 8.0    Glucose, UA NEGATIVE  NEGATIVE (mg/dL)   Hgb urine dipstick MODERATE (*)  NEGATIVE    Bilirubin Urine NEGATIVE  NEGATIVE    Ketones, ur NEGATIVE  NEGATIVE (mg/dL)   Protein, ur NEGATIVE  NEGATIVE (mg/dL)   Urobilinogen, UA 0.2  0.0 - 1.0 (mg/dL)   Nitrite NEGATIVE  NEGATIVE    Leukocytes, UA NEGATIVE  NEGATIVE   URINE MICROSCOPIC-ADD ON     Status: Normal   Collection Time   01/12/12 11:50 PM      Component Value Range   Squamous Epithelial / LPF RARE  RARE    WBC, UA 0-2  <3 (WBC/hpf)   RBC / HPF 3-6  <3 (RBC/hpf)   Bacteria, UA RARE  RARE    IMAGING US Ob Transvaginal  01/13/2012  *RADIOLOGY REPORT*  Transvaginal ultrasound images were obtained for evaluation of calcification inferior to the bladder.  See additional report of transabdominal  ultrasound of kidneys.  The echogenic shadowing structure is demonstrated below the bladder appears to be within the urethra.  This is likely a urethral stone. The structure described previously within the bladder is now located adjacent to the posterior wall of the bladder and appears to demonstrate color flow.  This may also represent a vascular structure.  Impression:  Calcification beneath the bladder consistent with a urethral stone.  Original Report Authenticated By: Marlon Pel, M.D.   US Renal  01/13/2012  *RADIOLOGY REPORT*  Clinical Data: Right flank pain and difficulty with urination.  [redacted] weeks pregnant.  RENAL/URINARY TRACT ULTRASOUND COMPLETE  Comparison:  CT abdomen 12/14/2010  Findings:  Right Kidney:  Right kidney measures 12.5 cm length.  There is moderate pyelocaliectasis.  No focal parenchymal atrophy.  No focal mass lesions.  Left Kidney:  Left kidney measures 12.6 cm length.  Normal homogeneous renal parenchymal echotexture without focal atrophy. No focal mass lesions.  No hydronephrosis.  Bladder:  The bladder wall is not thickened.  Bilateral urine flow jets are noted with color flow Doppler.  There is a tubular intraluminal structure within the bladder which is of nonspecific origin.  Query bladder catheter.  Echogenic shadowing structure inferior to the bladder which could represent calcification, catheter, or urethral stone.  IMPRESSION: Right renal hydronephrosis of nonspecific etiology.  Tubular intraluminal structure within the bladder which is a nonspecific origin and may represent catheter.  Clinical correlation is recommended.  Echogenic shadowing structure inferior to the bladder which could represent calcification, catheter, or urethral stone.  Original Report Authenticated By: Marlon Pel, M.D.   ED Course: [id number]: Pt requesting pain meds. But concerned about sedation. LR bolus and stadol 1 mg given per consult w/ Dr. Arelia Sneddon. 0300: No pain relief. Dr. Arelia Sneddon  notified. Morphine 4 mg.  0350: Minimal pain relief, 7/10 on pain scale. Renal US results reviewed w/ Dr. Arelia Sneddon. Admit.   ASSESSMENT 1. Urethral stone   2.  30.3 week pregnancy 3.  Tubular structure in bladder, possible prominent blood vessel per radiologist.  4.  Right renal hydronephrosis, likely pregnancy-related per radiologist.  PLAN Admit to antenatal for 23-hour obs for pain management and IV hydration Dilaudid PCA Dr. Arelia Sneddon to follow   Dorathy Kinsman 01/13/2012 4:33 AM

## 2012-01-13 NOTE — Progress Notes (Signed)
Feeling better since about 11:30 am. Pain was 7/10, now it is 3/10. However, not comfortable and still feels stone is not passed completely.  No dilaudid used in about 2 hours.  Pt on bedpan, bedpan removed prior to wiping, and two 1 inch pieces of white braided string noted in urine. Patient states no tampons or foreign bodies in vagina since conception.  VSS afeb  Cx-soft lower segment, closed, posterior, about 2 cm long      - no foreign body  Urine clear  A: Possible kidney stone  P: Back on fetal monitor, R/O PT UCs      Repeat U/S this pm-renal, will also do OB U/S      Pt C/O a lot of pain with vag probe U/S this am-D/W her importance of looking for stone and other findings-she will try to tolerate vag probe this afternoon

## 2012-01-13 NOTE — Progress Notes (Signed)
Reactive FHR.

## 2012-01-13 NOTE — Progress Notes (Signed)
30 3/7 weeks  C/O pain over bladder and some right flank C/W previous pain noted with kidney stone x 6 No gross blood in urine , no N/V, no fever No vaginal bleeding/ROM/mucous passage  Blood pressure 99/53, pulse 67, temperature 97.5 F (36.4 C), temperature source Oral, resp. rate 17, height 5\' 6"  (1.676 m), weight 67.586 kg (149 lb), last menstrual period 06/15/2011, SpO2 99.00%.  Abd uterus soft        Mild tenderness over bladder  Back no CVAT  FHT reactive UCs none  U/S - echogenic structure below bladder, poss vessel or stone   A: Pain, probable kidney stone  P: D/W patient-IV fluids, PCA Dilaudid     If pain persists, repeat U/S this afternoon to reevaluate bladder     If pain persists, will call urology consult

## 2012-01-14 ENCOUNTER — Encounter (HOSPITAL_COMMUNITY): Payer: Self-pay | Admitting: *Deleted

## 2012-01-14 MED ORDER — HYDROCODONE-ACETAMINOPHEN 5-325 MG PO TABS
1.0000 | ORAL_TABLET | ORAL | Status: DC | PRN
  Administered 2012-01-14: 1 via ORAL
  Filled 2012-01-14: qty 1

## 2012-01-14 MED ORDER — MAGNESIUM SULFATE 40 G IN LACTATED RINGERS - SIMPLE
1.0000 g/h | INTRAVENOUS | Status: DC
  Filled 2012-01-14: qty 500

## 2012-01-14 MED ORDER — TAMSULOSIN HCL 0.4 MG PO CAPS
0.4000 mg | ORAL_CAPSULE | Freq: Every day | ORAL | Status: DC
  Administered 2012-01-14 – 2012-01-16 (×3): 0.4 mg via ORAL
  Filled 2012-01-14 (×3): qty 1

## 2012-01-14 NOTE — Progress Notes (Signed)
UR Chart review completed.  

## 2012-01-14 NOTE — Progress Notes (Signed)
Patient ID: Kristina Pierce, female   DOB: 10-23-1975, 36 y.o.   MRN: [id number] [redacted]w[redacted]d  S// started with ^ ctx this am, currently on MgSO4 3 gms/hr w/ second BMZ due ~ 3 PM.  O//  BP 99/51  Pulse 77  Temp(Src) 98 F (36.7 C) (Oral)  Resp 20  Ht 5\' 6"  (1.676 m)  Wt 149 lb (67.586 kg)  BMI 24.05 kg/m2  SpO2 97%  LMP 06/15/2011  stable FHR  [place] yest  cx length 3.4 cm with prob uretero-ves stone noted  A+P// PTL with kidney stone noted  Will try to  decr MgSO4 today

## 2012-01-14 NOTE — Progress Notes (Signed)
Now only irreg, mild ctx, will decr MgSO4, my office called consult to Dr Hillis Range

## 2012-01-14 NOTE — Consult Note (Signed)
Urology Consult   Physician requesting consult: Marcelle Overlie  Reason for consult: nephrolithiasis  History of Present Illness: Kristina Pierce is a 36 y.o. who is [redacted] weeks pregnant and presented to the ED at [facility] on 01/13/12 with c/o right sided back pain, lower abdominal discomfort, frequency, and feelings of incomplete emptying.  She has a hx of nephrolithiasis and stated that her sx were similar to stone episodes she had in the past.  U/S revealed a 1cm right sided UVJ stone with hydro.  Pt was admitted for pain control.  She subsequently started having contractions and was started on IV Magnesium.  Initially her pain was only controlled with Dilaudid PCA.  This was weaned last night and she was started on PO Vicodin.  Pt states she has not had any pain meds since 11pm last night and is currently comfortable.  Her sx of frequency and feeling of incomplete emptyting have resolved.  She denies N/V, SOB, CP, F/C, and contractions.  She has been straining her urine with no stone retrieval at this time.    Urine culture revealed 50,000 colonies of gram negative rods with final report pending.  She is receiving IV rocephin.    She denies a history of voiding or storage urinary symptoms, hematuria, UTIs, STDs, GU malignancy/trauma/surgery.  Past Medical History  Diagnosis Date  . Kidney stone     Past Surgical History  Procedure Date  . Appendectomy   . Wisdom tooth extraction   . Tonsillectomy      Current Hospital Medications: Scheduled Meds:   . betamethasone acetate-betamethasone sodium phosphate  12 mg Intramuscular Q24H  . cefTRIAXone (ROCEPHIN)  IV  1 g Intravenous Q12H  . docusate sodium  100 mg Oral Daily  . famotidine  20 mg Oral BID  . prenatal multivitamin  1 tablet Oral Daily  . DISCONTD: HYDROmorphone PCA 0.3 mg/mL   Intravenous Q4H   Continuous Infusions:   . lactated ringers 125 mL/hr at 01/14/12 1115  . magnesium sulfate 40 grams in LR 500 mL 2 g/hr  (01/14/12 1345)  . DISCONTD: magnesium sulfate 40 grams in LR 500 mL 3 g/hr (01/14/12 0820)   PRN Meds:.acetaminophen, calcium carbonate, diphenhydrAMINE, diphenhydrAMINE, HYDROcodone-acetaminophen, naloxone, ondansetron (ZOFRAN) IV, sodium chloride, zolpidem, DISCONTD: oxyCODONE-acetaminophen  Allergies:  Allergies  Allergen Reactions  . Chlorhexidine Other (See Comments)    Rash and itching    No family history on file.  Social History:  reports that she has never smoked. She has never used smokeless tobacco. She reports that she does not drink alcohol or use illicit drugs.  ROS: A complete review of systems was performed.  All systems are negative except for pertinent findings as noted.  Physical Exam:  Vital signs in last 24 hours: Temp:  [97.7 F (36.5 C)-98.2 F (36.8 C)] 98.2 F (36.8 C) (05/28 1301) Pulse Rate:  [63-99] 74  (05/28 1501) Resp:  [16-20] 20  (05/28 1501) BP: (72-130)/(42-80) 94/45 mmHg (05/28 1501) SpO2:  [96 %-100 %] 97 % (05/27 1820) General:  Alert and oriented, No acute distress HEENT: Normocephalic, atraumatic Neck: No JVD or lymphadenopathy Cardiovascular: Regular rate and rhythm Lungs: Clear bilaterally Abdomen: Soft, gravid uterus Back: No CVA tenderness Extremities: No edema Neurologic: Grossly intact  Laboratory Data:  Lab Results  Component Value Date   WBC 13.4* 12/14/2010   HGB 13.4 12/14/2010   HCT 38.9 12/14/2010   MCV 92.0 12/14/2010   PLT 226 12/14/2010   Lab Results  Component Value Date  CREATININE .5 12/14/2010    Recent Results (from the past 240 hour(s))  URINE CULTURE     Status: Normal   Collection Time   01/05/12 11:05 AM      Component Value Range Status Comment   Specimen Description OB CLEAN CATCH   Final    Special Requests NONE   Final    Culture  Setup Time [id number]   Final    Colony Count 40,000 COLONIES/ML   Final    Culture     Final    Value: Multiple bacterial morphotypes present, none predominant.  Suggest appropriate recollection if clinically indicated.   Report Status 01/07/2012 FINAL   Final   WET PREP, GENITAL     Status: Abnormal   Collection Time   01/05/12 12:06 PM      Component Value Range Status Comment   Yeast Wet Prep HPF POC NONE SEEN  NONE SEEN  Final    Trich, Wet Prep NONE SEEN  NONE SEEN  Final    Clue Cells Wet Prep HPF POC NEGATIVE (*) NONE SEEN  Final    WBC, Wet Prep HPF POC FEW (*) NONE SEEN  Final MODERATE BACTERIA SEEN  URINE CULTURE     Status: Normal (Preliminary result)   Collection Time   01/12/12 11:50 PM      Component Value Range Status Comment   Specimen Description URINE, CLEAN CATCH   Final    Special Requests Normal   Final    Culture  Setup Time [id number]   Final    Colony Count 50,000 COLONIES/ML   Final    Culture GRAM NEGATIVE RODS   Final    Report Status PENDING   Incomplete     Renal Function: No results found for this basename: CREATININE:7 in the last 168 hours Estimated Creatinine Clearance: 91 ml/min (by C-G formula based on Cr of 0.5).  Radiologic Imaging: US Ob Comp + 14 Wk  01/14/2012  OBSTETRICAL ULTRASOUND: This exam was performed within a [place]. The OB US report was generated in the AS system, and faxed to the ordering physician.   This report is also available in [employer] and in the [employer]. See AS Obstetric US report.   US Ob Transvaginal  01/14/2012  OBSTETRICAL ULTRASOUND: This exam was performed within a [place] [facility]. The OB US report was generated in the AS system, and faxed to the ordering physician.   This report is also available in [employer] and in the [employer]. See AS Obstetric US report.   US Ob Transvaginal  01/13/2012  *RADIOLOGY REPORT*  Transvaginal ultrasound images were obtained for evaluation of calcification inferior to the bladder.  See additional report of transabdominal ultrasound of kidneys.  The  echogenic shadowing structure is demonstrated below the bladder appears to be within the urethra.  This is likely a urethral stone. The structure described previously within the bladder is now located adjacent to the posterior wall of the bladder and appears to demonstrate color flow.  This may also represent a vascular structure.  Impression:  Calcification beneath the bladder consistent with a urethral stone.  Original Report Authenticated By: Marlon Pel, M.D.   US Renal  01/13/2012  *RADIOLOGY REPORT*  Clinical Data:  [redacted] weeks pregnant.  Right flank pain.  Right ureteral calculus.  Follow-up hydronephrosis.  RENAL/URINARY TRACT ULTRASOUND COMPLETE  Comparison:  01/13/2012  Findings:  Right Kidney:  Normal in size and parenchymal  echogenicity. Moderate hydronephrosis shows no significant change since previous study.  No evidence of right renal mass.  Left Kidney:  Normal in size and parenchymal echogenicity.  No evidence of mass or hydronephrosis.  Bladder:  Appears normal for degree of bladder distention.  A shadowing calculus is seen at the right ureterovesicle junction which measures approximately 1 cm.  This is confirmed on transvaginal sonography.  IMPRESSION:  1.  Moderate right hydronephrosis, without significant change. 2.  1 cm distal right ureteral calculus visualized at the right UVJ.  Original Report Authenticated By: Danae Orleans, M.D.   US Renal  01/13/2012  *RADIOLOGY REPORT*  Clinical Data: Right flank pain and difficulty with urination.  [redacted] weeks pregnant.  RENAL/URINARY TRACT ULTRASOUND COMPLETE  Comparison:  CT abdomen 12/14/2010  Findings:  Right Kidney:  Right kidney measures 12.5 cm length.  There is moderate pyelocaliectasis.  No focal parenchymal atrophy.  No focal mass lesions.  Left Kidney:  Left kidney measures 12.6 cm length.  Normal homogeneous renal parenchymal echotexture without focal atrophy. No focal mass lesions.  No hydronephrosis.  Bladder:  The bladder wall is  not thickened.  Bilateral urine flow jets are noted with color flow Doppler.  There is a tubular intraluminal structure within the bladder which is of nonspecific origin.  Query bladder catheter.  Echogenic shadowing structure inferior to the bladder which could represent calcification, catheter, or urethral stone.  IMPRESSION: Right renal hydronephrosis of nonspecific etiology.  Tubular intraluminal structure within the bladder which is a nonspecific origin and may represent catheter.  Clinical correlation is recommended.  Echogenic shadowing structure inferior to the bladder which could represent calcification, catheter, or urethral stone.  Original Report Authenticated By: Marlon Pel, M.D.    I independently reviewed the most current renal U/S with the radiologist to confirm a 1cm right sided UVJ stone.  Impression/Assessment:   1cm right sided UVJ stone with hydro  Plan:  Her pain is currently controlled and her sx have resolved.  She would like to wait to see if she can pass the stone and/or control her sx instead of having a procedure.  I spoke with the pharmacist and there is no drug interaction between IV magnesium and Flomax, therefore, will initiate Flomax. Agree with above. Will continue observational care. Dr. Lynnae Sandhoff to return next week. Brand Males 01/14/2012, 3:38 PM

## 2012-01-15 LAB — URINE CULTURE: Culture  Setup Time: 201305271722

## 2012-01-15 MED ORDER — DOCUSATE SODIUM 100 MG PO CAPS
100.0000 mg | ORAL_CAPSULE | Freq: Every day | ORAL | Status: DC
  Administered 2012-01-15 – 2012-01-16 (×2): 100 mg via ORAL
  Filled 2012-01-15 (×2): qty 1

## 2012-01-15 MED ORDER — BISACODYL 10 MG RE SUPP
10.0000 mg | Freq: Every day | RECTAL | Status: DC | PRN
  Administered 2012-01-15: 10 mg via RECTAL
  Filled 2012-01-15: qty 1

## 2012-01-15 NOTE — Progress Notes (Signed)
Pt reports pain is 1/10.  No ctx, vb, or lof.  + FM  AF, VSS Abd - gravid, NT Ext - NT PV - deferred  A/P:  Plan to ween off magnesium today.

## 2012-01-16 MED ORDER — HYDROCODONE-ACETAMINOPHEN 5-325 MG PO TABS: 1.0000 | ORAL_TABLET | ORAL | Status: AC | PRN

## 2012-01-16 MED ORDER — TAMSULOSIN HCL 0.4 MG PO CAPS: 0.4000 mg | ORAL_CAPSULE | Freq: Every day | ORAL | Status: DC

## 2012-01-16 NOTE — Discharge Summary (Signed)
Admission Diagnosis: Renal Colic IUP at 30 w   Discharge Diagnosis: Same  Hospital Course: 36 year old G 1 at 40 w 6 days presents acute right sided pain. She has a history of kidney stones. She was admitted and placed on Magnesium and given betamethasone for some pre term labor. She was consulted by urology while in the hospital. She was discharged home in good condition today and will follow up with her urologist, Dr. Retta Diones on Monday. She was given a Rx for vicodin and Rx for flomax to take. She will follow up with [place] next week.

## 2012-01-16 NOTE — Progress Notes (Signed)
Patient feels completely fine today. No dysuria. Has not passed the stone. Afebrile Vital signs stable Abdomen is soft and non tender  IMPRESSION: IUP at 30 w 6 days Kidney stone  PLAN: Discharge home with Flomax and vicodin Follow up with Dr. Retta Diones on Monday Follow up with [place] next week

## 2012-03-16 ENCOUNTER — Encounter (HOSPITAL_COMMUNITY): Payer: Self-pay | Admitting: Anesthesiology

## 2012-03-16 ENCOUNTER — Inpatient Hospital Stay (HOSPITAL_COMMUNITY): Payer: BC Managed Care – PPO | Admitting: Anesthesiology

## 2012-03-16 ENCOUNTER — Inpatient Hospital Stay (HOSPITAL_COMMUNITY)
Admission: AD | Admit: 2012-03-16 | Discharge: 2012-03-18 | DRG: 373 | Disposition: A | Discharge status: Home / Self Care | Payer: BC Managed Care – PPO | Source: Ambulatory Visit | Attending: Obstetrics and Gynecology | Admitting: Obstetrics and Gynecology

## 2012-03-16 ENCOUNTER — Encounter (HOSPITAL_COMMUNITY): Payer: Self-pay | Admitting: *Deleted

## 2012-03-16 DIAGNOSIS — O09519 Supervision of elderly primigravida, unspecified trimester: Secondary | ICD-10-CM | POA: Diagnosis present

## 2012-03-16 LAB — CBC
Platelets: 206 10*3/uL (ref 150–400)
RBC: 3.65 MIL/uL — ABNORMAL LOW (ref 3.87–5.11)
RDW: 13.6 % (ref 11.5–15.5)
WBC: 11.3 10*3/uL — ABNORMAL HIGH (ref 4.0–10.5)

## 2012-03-16 LAB — RPR: RPR Ser Ql: NONREACTIVE

## 2012-03-16 MED ORDER — LIDOCAINE HCL (PF) 1 % IJ SOLN: 30.0000 mL | INTRAMUSCULAR | Status: DC | PRN

## 2012-03-16 MED ORDER — LACTATED RINGERS IV SOLN: 500.0000 mL | Freq: Once | INTRAVENOUS | Status: DC

## 2012-03-16 MED ORDER — PRENATAL MULTIVITAMIN CH
1.0000 | ORAL_TABLET | Freq: Every day | ORAL | Status: DC
  Administered 2012-03-16 – 2012-03-18 (×3): 1 via ORAL
  Filled 2012-03-16 (×3): qty 1

## 2012-03-16 MED ORDER — DIPHENHYDRAMINE HCL 50 MG/ML IJ SOLN: 12.5000 mg | INTRAMUSCULAR | Status: DC | PRN

## 2012-03-16 MED ORDER — LACTATED RINGERS IV SOLN: 500.0000 mL | INTRAVENOUS | Status: DC | PRN

## 2012-03-16 MED ORDER — PHENYLEPHRINE 40 MCG/ML (10ML) SYRINGE FOR IV PUSH (FOR BLOOD PRESSURE SUPPORT)
80.0000 ug | PREFILLED_SYRINGE | INTRAVENOUS | Status: DC | PRN
  Filled 2012-03-16: qty 5

## 2012-03-16 MED ORDER — SENNOSIDES-DOCUSATE SODIUM 8.6-50 MG PO TABS
2.0000 | ORAL_TABLET | Freq: Every day | ORAL | Status: DC
  Administered 2012-03-16 – 2012-03-17 (×2): 2 via ORAL

## 2012-03-16 MED ORDER — ACETAMINOPHEN 325 MG PO TABS: 650.0000 mg | ORAL_TABLET | ORAL | Status: DC | PRN

## 2012-03-16 MED ORDER — DIBUCAINE 1 % RE OINT
1.0000 "application " | TOPICAL_OINTMENT | RECTAL | Status: DC | PRN
  Administered 2012-03-16: 1 via RECTAL
  Filled 2012-03-16: qty 28

## 2012-03-16 MED ORDER — LIDOCAINE HCL (PF) 1 % IJ SOLN
INTRAMUSCULAR | Status: DC | PRN
  Administered 2012-03-16 (×2): 5 mL

## 2012-03-16 MED ORDER — BENZOCAINE-MENTHOL 20-0.5 % EX AERO
1.0000 "application " | INHALATION_SPRAY | CUTANEOUS | Status: DC | PRN
  Administered 2012-03-16: 1 via TOPICAL
  Filled 2012-03-16: qty 56

## 2012-03-16 MED ORDER — TERBUTALINE SULFATE 1 MG/ML IJ SOLN: 0.2500 mg | Freq: Once | INTRAMUSCULAR | Status: DC | PRN

## 2012-03-16 MED ORDER — CITRIC ACID-SODIUM CITRATE 334-500 MG/5ML PO SOLN: 30.0000 mL | ORAL | Status: DC | PRN

## 2012-03-16 MED ORDER — FLEET ENEMA 7-19 GM/118ML RE ENEM: 1.0000 | ENEMA | RECTAL | Status: DC | PRN

## 2012-03-16 MED ORDER — LACTATED RINGERS IV SOLN
INTRAVENOUS | Status: DC
  Administered 2012-03-16 (×2): via INTRAVENOUS

## 2012-03-16 MED ORDER — WITCH HAZEL-GLYCERIN EX PADS
1.0000 "application " | MEDICATED_PAD | CUTANEOUS | Status: DC | PRN
  Administered 2012-03-16: 1 via TOPICAL

## 2012-03-16 MED ORDER — ONDANSETRON HCL 4 MG/2ML IJ SOLN: 4.0000 mg | Freq: Four times a day (QID) | INTRAMUSCULAR | Status: DC | PRN

## 2012-03-16 MED ORDER — ZOLPIDEM TARTRATE 5 MG PO TABS: 5.0000 mg | ORAL_TABLET | Freq: Every evening | ORAL | Status: DC | PRN

## 2012-03-16 MED ORDER — TETANUS-DIPHTH-ACELL PERTUSSIS 5-2.5-18.5 LF-MCG/0.5 IM SUSP: 0.5000 mL | Freq: Once | INTRAMUSCULAR | Status: DC

## 2012-03-16 MED ORDER — MEDROXYPROGESTERONE ACETATE 150 MG/ML IM SUSP: 150.0000 mg | INTRAMUSCULAR | Status: DC | PRN

## 2012-03-16 MED ORDER — IBUPROFEN 600 MG PO TABS: 600.0000 mg | ORAL_TABLET | Freq: Four times a day (QID) | ORAL | Status: DC | PRN

## 2012-03-16 MED ORDER — BISACODYL 10 MG RE SUPP: 10.0000 mg | Freq: Every day | RECTAL | Status: DC | PRN

## 2012-03-16 MED ORDER — EPHEDRINE 5 MG/ML INJ
10.0000 mg | INTRAVENOUS | Status: DC | PRN
  Filled 2012-03-16: qty 4

## 2012-03-16 MED ORDER — PHENYLEPHRINE 40 MCG/ML (10ML) SYRINGE FOR IV PUSH (FOR BLOOD PRESSURE SUPPORT): 80.0000 ug | PREFILLED_SYRINGE | INTRAVENOUS | Status: DC | PRN

## 2012-03-16 MED ORDER — FLEET ENEMA 7-19 GM/118ML RE ENEM: 1.0000 | ENEMA | Freq: Every day | RECTAL | Status: DC | PRN

## 2012-03-16 MED ORDER — FENTANYL 2.5 MCG/ML BUPIVACAINE 1/10 % EPIDURAL INFUSION (WH - ANES)
14.0000 mL/h | INTRAMUSCULAR | Status: DC
  Administered 2012-03-16: 14 mL/h via EPIDURAL
  Filled 2012-03-16: qty 60

## 2012-03-16 MED ORDER — LANOLIN HYDROUS EX OINT: TOPICAL_OINTMENT | CUTANEOUS | Status: DC | PRN

## 2012-03-16 MED ORDER — EPHEDRINE 5 MG/ML INJ: 10.0000 mg | INTRAVENOUS | Status: DC | PRN

## 2012-03-16 MED ORDER — OXYTOCIN BOLUS FROM INFUSION
250.0000 mL | Freq: Once | INTRAVENOUS | Status: DC
  Filled 2012-03-16: qty 500

## 2012-03-16 MED ORDER — OXYCODONE-ACETAMINOPHEN 5-325 MG PO TABS: 1.0000 | ORAL_TABLET | ORAL | Status: DC | PRN

## 2012-03-16 MED ORDER — ONDANSETRON HCL 4 MG/2ML IJ SOLN: 4.0000 mg | INTRAMUSCULAR | Status: DC | PRN

## 2012-03-16 MED ORDER — OXYTOCIN 40 UNITS IN LACTATED RINGERS INFUSION - SIMPLE MED: 62.5000 mL/h | Freq: Once | INTRAVENOUS | Status: DC

## 2012-03-16 MED ORDER — IBUPROFEN 600 MG PO TABS
600.0000 mg | ORAL_TABLET | Freq: Four times a day (QID) | ORAL | Status: DC
  Administered 2012-03-16 – 2012-03-18 (×6): 600 mg via ORAL
  Filled 2012-03-16 (×6): qty 1

## 2012-03-16 MED ORDER — DIPHENHYDRAMINE HCL 25 MG PO CAPS: 25.0000 mg | ORAL_CAPSULE | Freq: Four times a day (QID) | ORAL | Status: DC | PRN

## 2012-03-16 MED ORDER — ONDANSETRON HCL 4 MG PO TABS: 4.0000 mg | ORAL_TABLET | ORAL | Status: DC | PRN

## 2012-03-16 MED ORDER — SIMETHICONE 80 MG PO CHEW: 80.0000 mg | CHEWABLE_TABLET | ORAL | Status: DC | PRN

## 2012-03-16 MED ORDER — MEASLES, MUMPS & RUBELLA VAC ~~LOC~~ INJ: 0.5000 mL | INJECTION | Freq: Once | SUBCUTANEOUS | Status: DC

## 2012-03-16 MED ORDER — OXYTOCIN 40 UNITS IN LACTATED RINGERS INFUSION - SIMPLE MED
1.0000 m[IU]/min | INTRAVENOUS | Status: DC
  Administered 2012-03-16: 666 m[IU]/min via INTRAVENOUS
  Administered 2012-03-16: 2 m[IU]/min via INTRAVENOUS
  Filled 2012-03-16: qty 1000

## 2012-03-16 NOTE — H&P (Signed)
36 year old G 1 P 0 at 77 w 3 days presents with SROM clear fluid at 1130 this am. Mild contractions.  Good FM  Afebrile Vital signs stable General alert and oriented Fetal heart rate is reactive Contractions mild every 3 minutes Lung CTAB Car RRR Abdomen is soft and non tender Cervix in office 90% 2 cm -2 Vertex Clear fluid  IMPRESSION: IUP at 39 3 SROM Labor  Plan: Admit Pitocin Epidural ok

## 2012-03-16 NOTE — Anesthesia Procedure Notes (Signed)
Epidural Patient location during procedure: OB Start time: 03/16/2012 3:31 PM  Staffing Anesthesiologist: Brayton Caves R Performed by: anesthesiologist   Preanesthetic Checklist Completed: patient identified, site marked, surgical consent, pre-op evaluation, timeout performed, IV checked, risks and benefits discussed and monitors and equipment checked  Epidural Patient position: sitting Prep: site prepped and draped and DuraPrep Patient monitoring: continuous pulse ox and blood pressure Approach: midline Injection technique: LOR air and LOR saline  Needle:  Needle type: Tuohy  Needle gauge: 17 G Needle length: 9 cm Needle insertion depth: 5 cm cm Catheter type: closed end flexible Catheter size: 19 Gauge Catheter at skin depth: 10 cm Test dose: negative  Assessment Events: blood not aspirated, injection not painful, no injection resistance, negative IV test and no paresthesia  Additional Notes Patient identified.  Risk benefits discussed including failed block, incomplete pain control, headache, nerve damage, paralysis, blood pressure changes, nausea, vomiting, reactions to medication both toxic or allergic, and postpartum back pain.  Patient expressed understanding and wished to proceed.  All questions were answered.  Sterile technique used throughout procedure and epidural site dressed with sterile barrier dressing. No paresthesia or other complications noted.The patient did not experience any signs of intravascular injection such as tinnitus or metallic taste in mouth nor signs of intrathecal spread such as rapid motor block. Please see nursing notes for vital signs.

## 2012-03-16 NOTE — Anesthesia Preprocedure Evaluation (Signed)

## 2012-03-17 LAB — CBC
HCT: 29.7 % — ABNORMAL LOW (ref 36.0–46.0)
Hemoglobin: 10.5 g/dL — ABNORMAL LOW (ref 12.0–15.0)
MCH: 33.7 pg (ref 26.0–34.0)
MCHC: 35.4 g/dL (ref 30.0–36.0)
MCV: 95.2 fL (ref 78.0–100.0)
RBC: 3.12 MIL/uL — ABNORMAL LOW (ref 3.87–5.11)

## 2012-03-17 MED ORDER — HYDROCODONE-ACETAMINOPHEN 5-325 MG PO TABS
1.0000 | ORAL_TABLET | ORAL | Status: DC | PRN
  Administered 2012-03-17 – 2012-03-18 (×3): 1 via ORAL
  Filled 2012-03-17 (×3): qty 1

## 2012-03-17 NOTE — Progress Notes (Signed)
Post Partum Day 1 Subjective: no complaints, up ad lib, voiding and tolerating PO  Objective: Blood pressure 101/64, pulse 70, temperature 98.4 F (36.9 C), temperature source Oral, resp. rate 18, last menstrual period 06/15/2011, SpO2 97.00%.  Physical Exam:  General: alert and cooperative Lochia: appropriate Uterine Fundus: firm Incision: perineum intact  DVT Evaluation: No evidence of DVT seen on physical exam.   Basename 03/17/12 0505 03/16/12 1230  HGB 10.5* 11.9*  HCT 29.7* 34.4*    Assessment/Plan: Plan for discharge tomorrow   LOS: 1 day   Willam Munford G 03/17/2012, 8:15 AM

## 2012-03-17 NOTE — Anesthesia Postprocedure Evaluation (Signed)
Anesthesia Post Note  Patient: Kristina Pierce  Procedure(s) Performed: * No procedures listed *  Anesthesia type: Epidural  Patient location: Mother/Baby  Post pain: Pain level controlled  Post assessment: Post-op Vital signs reviewed  Last Vitals:  Filed Vitals:   03/17/12 0522  BP: 101/64  Pulse: 70  Temp: 36.9 C  Resp: 18    Post vital signs: Reviewed  Level of consciousness:alert  Complications: No apparent anesthesia complications

## 2012-03-18 ENCOUNTER — Encounter (HOSPITAL_COMMUNITY): Payer: Self-pay | Admitting: *Deleted

## 2012-03-18 MED ORDER — WITCH HAZEL-GLYCERIN EX PADS: 1.0000 "application " | MEDICATED_PAD | CUTANEOUS | Status: AC | PRN

## 2012-03-18 MED ORDER — HYDROCORTISONE ACE-PRAMOXINE 1-1 % RE FOAM: 1.0000 | Freq: Two times a day (BID) | RECTAL | Status: AC

## 2012-03-18 MED ORDER — HYDROCORTISONE ACE-PRAMOXINE 1-1 % RE FOAM
1.0000 | Freq: Two times a day (BID) | RECTAL | Status: DC
  Administered 2012-03-18: 1 via RECTAL
  Filled 2012-03-18: qty 10

## 2012-03-18 MED ORDER — HYDROCODONE-ACETAMINOPHEN 5-325 MG PO TABS: 1.0000 | ORAL_TABLET | ORAL | Status: AC | PRN

## 2012-03-18 MED ORDER — IBUPROFEN 600 MG PO TABS: 600.0000 mg | ORAL_TABLET | Freq: Four times a day (QID) | ORAL | Status: AC

## 2012-03-18 NOTE — Discharge Summary (Signed)
Obstetric Discharge Summary Reason for Admission: rupture of membranes Prenatal Procedures: ultrasound Intrapartum Procedures: spontaneous vaginal delivery Postpartum Procedures: none Complications-Operative and Postpartum: 2 degree perineal laceration Hemoglobin  Date Value Range Status  03/17/2012 10.5* 12.0 - 15.0 g/dL Final     HCT  Date Value Range Status  03/17/2012 29.7* 36.0 - 46.0 % Final    Physical Exam:  General: alert and cooperative Lochia: appropriate Uterine Fundus: firm Incision: perineum intact DVT Evaluation: No evidence of DVT seen on physical exam.  Discharge Diagnoses: Term Pregnancy-delivered  Discharge Information: Date: 03/18/2012 Activity: pelvic rest Diet: routine Medications: PNV, Ibuprofen and Vicodin Condition: stable Instructions: refer to practice specific booklet Discharge to: home   Newborn Data: Live born female  Birth Weight: 7 lb 5.5 oz (3331 g) APGAR: 8, 9  Home with mother.  Kristina Pierce G 03/18/2012, 8:54 AM

## 2013-02-23 ENCOUNTER — Other Ambulatory Visit: Payer: Self-pay | Admitting: Obstetrics and Gynecology

## 2013-02-23 DIAGNOSIS — R928 Other abnormal and inconclusive findings on diagnostic imaging of breast: Secondary | ICD-10-CM

## 2013-03-03 ENCOUNTER — Ambulatory Visit
Admission: RE | Admit: 2013-03-03 | Discharge: 2013-03-03 | Disposition: A | Discharge status: Home / Self Care | Payer: BC Managed Care – PPO | Source: Ambulatory Visit | Attending: Obstetrics and Gynecology | Admitting: Obstetrics and Gynecology

## 2013-03-03 DIAGNOSIS — R928 Other abnormal and inconclusive findings on diagnostic imaging of breast: Secondary | ICD-10-CM

## 2013-11-26 ENCOUNTER — Ambulatory Visit (INDEPENDENT_AMBULATORY_CARE_PROVIDER_SITE_OTHER): Payer: BC Managed Care – PPO | Admitting: Nurse Practitioner

## 2013-11-26 ENCOUNTER — Encounter: Payer: Self-pay | Admitting: Nurse Practitioner

## 2013-11-26 VITALS — BP 100/70 | HR 74 | Temp 97.8°F | Ht 66.0 in | Wt 139.0 lb

## 2013-11-26 DIAGNOSIS — N200 Calculus of kidney: Secondary | ICD-10-CM

## 2013-11-26 DIAGNOSIS — R202 Paresthesia of skin: Secondary | ICD-10-CM

## 2013-11-26 DIAGNOSIS — Z0000 Encounter for general adult medical examination without abnormal findings: Secondary | ICD-10-CM

## 2013-11-26 DIAGNOSIS — G5760 Lesion of plantar nerve, unspecified lower limb: Secondary | ICD-10-CM

## 2013-11-26 DIAGNOSIS — G5762 Lesion of plantar nerve, left lower limb: Secondary | ICD-10-CM

## 2013-11-26 DIAGNOSIS — R209 Unspecified disturbances of skin sensation: Secondary | ICD-10-CM

## 2013-11-26 DIAGNOSIS — R32 Unspecified urinary incontinence: Secondary | ICD-10-CM

## 2013-11-26 LAB — COMPREHENSIVE METABOLIC PANEL
ALBUMIN: 3.8 g/dL (ref 3.5–5.2)
ALK PHOS: 39 U/L (ref 39–117)
ALT: 13 U/L (ref 0–35)
AST: 15 U/L (ref 0–37)
BUN: 14 mg/dL (ref 6–23)
CO2: 28 mEq/L (ref 19–32)
Calcium: 9 mg/dL (ref 8.4–10.5)
Chloride: 106 mEq/L (ref 96–112)
Creatinine, Ser: 0.6 mg/dL (ref 0.4–1.2)
GFR: 128.7 mL/min (ref >60.00)
Glucose, Bld: 75 mg/dL (ref 70–99)
POTASSIUM: 3.8 meq/L (ref 3.5–5.1)
SODIUM: 140 meq/L (ref 135–145)
Total Bilirubin: 0.8 mg/dL (ref 0.3–1.2)
Total Protein: 7.1 g/dL (ref 6.0–8.3)

## 2013-11-26 LAB — CBC WITH DIFFERENTIAL/PLATELET
BASOS PCT: 0.9 % (ref 0.0–3.0)
Basophils Absolute: 0.1 10*3/uL (ref 0.0–0.1)
EOS ABS: 0.1 10*3/uL (ref 0.0–0.7)
Eosinophils Relative: 1.3 % (ref 0.0–5.0)
HCT: 41.8 % (ref 36.0–46.0)
Hemoglobin: 13.9 g/dL (ref 12.0–15.0)
Lymphocytes Relative: 21.8 % (ref 12.0–46.0)
Lymphs Abs: 1.6 10*3/uL (ref 0.7–4.0)
MCHC: 33.2 g/dL (ref 30.0–36.0)
MCV: 96.1 fl (ref 78.0–100.0)
MONO ABS: 0.3 10*3/uL (ref 0.1–1.0)
Monocytes Relative: 3.4 % (ref 3.0–12.0)
NEUTROS PCT: 72.6 % (ref 43.0–77.0)
Neutro Abs: 5.4 10*3/uL (ref 1.4–7.7)
PLATELETS: 248 10*3/uL (ref 150.0–400.0)
RBC: 4.35 Mil/uL (ref 3.87–5.11)
RDW: 13.4 % (ref 11.5–14.6)
WBC: 7.5 10*3/uL (ref 4.5–10.5)

## 2013-11-26 LAB — LIPID PANEL
CHOL/HDL RATIO: 4
Cholesterol: 178 mg/dL (ref 0–200)
HDL: 49.8 mg/dL (ref >39.00)
LDL CALC: 112 mg/dL — AB (ref 0–99)
Triglycerides: 82 mg/dL (ref 0.0–149.0)
VLDL: 16.4 mg/dL (ref 0.0–40.0)

## 2013-11-26 LAB — HIV ANTIBODY (ROUTINE TESTING W REFLEX): HIV 1&2 Ab, 4th Generation: NONREACTIVE

## 2013-11-26 LAB — T4, FREE: FREE T4: 1.01 ng/dL (ref 0.60–1.60)

## 2013-11-26 LAB — TSH: TSH: 0.91 u[IU]/mL (ref 0.35–5.50)

## 2013-11-26 LAB — VITAMIN B12: Vitamin B-12: 338 pg/mL (ref 211–911)

## 2013-11-26 LAB — HEMOGLOBIN A1C: Hgb A1c MFr Bld: 5.5 % (ref 4.6–6.5)

## 2013-11-26 NOTE — Progress Notes (Signed)
Pre visit review using our clinic review tool, if applicable. No additional management support is needed unless otherwise documented below in the visit note. 

## 2013-11-26 NOTE — Patient Instructions (Addendum)
Our office will call you with lab results and any necessary follow up. Referral to neuro has been placed. If B12 helps, you may cancel. Speak W/ Dr Berenice Primas about steroid injection for Morton's neuroma. If you see neuro-they may be willing to treat neuroma as well. Develop lifelong habits of exercise most days of the week: take a 30 minute walk. The benefits include lower risk for heart disease, diabetes, stroke, high blood pressure, lower rates of depression & dementia, better sleep quality & bone health, and weight loss. Pleasure to meet you!  Preventive Care for Adults, Female A healthy lifestyle and preventive care can promote health and wellness. Preventive health guidelines for women include the following key practices.  A routine yearly physical is a good way to check with your caregiver about your health and preventive screening. It is a chance to share any concerns and updates on your health, and to receive a thorough exam.  Visit your dentist for a routine exam and preventive care every 6 months. Brush your teeth twice a day and floss once a day. Good oral hygiene prevents tooth decay and gum disease.  The frequency of eye exams is based on your age, health, family medical history, use of contact lenses, and other factors. Follow your caregiver's recommendations for frequency of eye exams.  Eat a healthy diet. Foods like vegetables, fruits, whole grains, low-fat dairy products, and lean protein foods contain the nutrients you need without too many calories. Decrease your intake of foods high in solid fats, added sugars, and salt. Eat the right amount of calories for you.Get information about a proper diet from your caregiver, if necessary.  Regular physical exercise is one of the most important things you can do for your health. Most adults should get at least 150 minutes of moderate-intensity exercise (any activity that increases your heart rate and causes you to sweat) each week. In  addition, most adults need muscle-strengthening exercises on 2 or more days a week.  Maintain a healthy weight. The body mass index (BMI) is a screening tool to identify possible weight problems. It provides an estimate of body fat based on height and weight. Your caregiver can help determine your BMI, and can help you achieve or maintain a healthy weight.For adults 20 years and older:  A BMI below 18.5 is considered underweight.  A BMI of 18.5 to 24.9 is normal.  A BMI of 25 to 29.9 is considered overweight.  A BMI of 30 and above is considered obese.  Maintain normal blood lipids and cholesterol levels by exercising and minimizing your intake of saturated fat. Eat a balanced diet with plenty of fruit and vegetables. Blood tests for lipids and cholesterol should begin at age 63 and be repeated every 5 years. If your lipid or cholesterol levels are high, you are over 50, or you are at high risk for heart disease, you may need your cholesterol levels checked more frequently.Ongoing high lipid and cholesterol levels should be treated with medicines if diet and exercise are not effective.  If you smoke, find out from your caregiver how to quit. If you do not use tobacco, do not start.  Lung cancer screening is recommended for adults aged 22 80 years who are at high risk for developing lung cancer because of a history of smoking. Yearly low-dose computed tomography (CT) is recommended for people who have at least a 30-pack-year history of smoking and are a current smoker or have quit within the past 15 years.  A pack year of smoking is smoking an average of 1 pack of cigarettes a day for 1 year (for example: 1 pack a day for 30 years or 2 packs a day for 15 years). Yearly screening should continue until the smoker has stopped smoking for at least 15 years. Yearly screening should also be stopped for people who develop a health problem that would prevent them from having lung cancer treatment.  If you  are pregnant, do not drink alcohol. If you are breastfeeding, be very cautious about drinking alcohol. If you are not pregnant and choose to drink alcohol, do not exceed 1 drink per day. One drink is considered to be 12 ounces (355 mL) of beer, 5 ounces (148 mL) of wine, or 1.5 ounces (44 mL) of liquor.  Avoid use of street drugs. Do not share needles with anyone. Ask for help if you need support or instructions about stopping the use of drugs.  High blood pressure causes heart disease and increases the risk of stroke. Your blood pressure should be checked at least every 1 to 2 years. Ongoing high blood pressure should be treated with medicines if weight loss and exercise are not effective.  If you are 36 to 38 years old, ask your caregiver if you should take aspirin to prevent strokes.  Diabetes screening involves taking a blood sample to check your fasting blood sugar level. This should be done once every 3 years, after age 43, if you are within normal weight and without risk factors for diabetes. Testing should be considered at a younger age or be carried out more frequently if you are overweight and have at least 1 risk factor for diabetes.  Breast cancer screening is essential preventive care for women. You should practice "breast self-awareness." This means understanding the normal appearance and feel of your breasts and may include breast self-examination. Any changes detected, no matter how small, should be reported to a caregiver. Women in their 61s and 30s should have a clinical breast exam (CBE) by a caregiver as part of a regular health exam every 1 to 3 years. After age 2, women should have a CBE every year. Starting at age 19, women should consider having a mammography (breast X-ray test) every year. Women who have a family history of breast cancer should talk to their caregiver about genetic screening. Women at a high risk of breast cancer should talk to their caregivers about having  magnetic resonance imaging (MRI) and a mammography every year.  Breast cancer gene (BRCA)-related cancer risk assessment is recommended for women who have family members with BRCA-related cancers. BRCA-related cancers include breast, ovarian, tubal, and peritoneal cancers. Having family members with these cancers may be associated with an increased risk for harmful changes (mutations) in the breast cancer genes BRCA1 and BRCA2. Results of the assessment will determine the need for genetic counseling and BRCA1 and BRCA2 testing.  The Pap test is a screening test for cervical cancer. A Pap test can show cell changes on the cervix that might become cervical cancer if left untreated. A Pap test is a procedure in which cells are obtained and examined from the lower end of the uterus (cervix).  Women should have a Pap test starting at age 38.  Between ages 62 and 71, Pap tests should be repeated every 2 years.  Beginning at age 75, you should have a Pap test every 3 years as long as the past 3 Pap tests have been normal.  Some  women have medical problems that increase the chance of getting cervical cancer. Talk to your caregiver about these problems. It is especially important to talk to your caregiver if a new problem develops soon after your last Pap test. In these cases, your caregiver may recommend more frequent screening and Pap tests.  The above recommendations are the same for women who have or have not gotten the vaccine for human papillomavirus (HPV).  If you had a hysterectomy for a problem that was not cancer or a condition that could lead to cancer, then you no longer need Pap tests. Even if you no longer need a Pap test, a regular exam is a good idea to make sure no other problems are starting.  If you are between ages 73 and 93, and you have had normal Pap tests going back 10 years, you no longer need Pap tests. Even if you no longer need a Pap test, a regular exam is a good idea to make  sure no other problems are starting.  If you have had past treatment for cervical cancer or a condition that could lead to cancer, you need Pap tests and screening for cancer for at least 20 years after your treatment.  If Pap tests have been discontinued, risk factors (such as a new sexual partner) need to be reassessed to determine if screening should be resumed.  The HPV test is an additional test that may be used for cervical cancer screening. The HPV test looks for the virus that can cause the cell changes on the cervix. The cells collected during the Pap test can be tested for HPV. The HPV test could be used to screen women aged 14 years and older, and should be used in women of any age who have unclear Pap test results. After the age of 4, women should have HPV testing at the same frequency as a Pap test.  Colorectal cancer can be detected and often prevented. Most routine colorectal cancer screening begins at the age of 75 and continues through age 52. However, your caregiver may recommend screening at an earlier age if you have risk factors for colon cancer. On a yearly basis, your caregiver may provide home test kits to check for hidden blood in the stool. Use of a small camera at the end of a tube, to directly examine the colon (sigmoidoscopy or colonoscopy), can detect the earliest forms of colorectal cancer. Talk to your caregiver about this at age 39, when routine screening begins. Direct examination of the colon should be repeated every 5 to 10 years through age 75, unless early forms of pre-cancerous polyps or small growths are found.  Hepatitis C blood testing is recommended for all people born from [birth year] through 1965 and any individual with known risks for hepatitis C.  Practice safe sex. Use condoms and avoid high-risk sexual practices to reduce the spread of sexually transmitted infections (STIs). STIs include gonorrhea, chlamydia, syphilis, trichomonas, herpes, HPV, and human  immunodeficiency virus (HIV). Herpes, HIV, and HPV are viral illnesses that have no cure. They can result in disability, cancer, and death. Sexually active women aged 32 and younger should be checked for chlamydia. Older women with new or multiple partners should also be tested for chlamydia. Testing for other STIs is recommended if you are sexually active and at increased risk.  Osteoporosis is a disease in which the bones lose minerals and strength with aging. This can result in serious bone fractures. The risk of osteoporosis can  be identified using a bone density scan. Women ages 23 and over and women at risk for fractures or osteoporosis should discuss screening with their caregivers. Ask your caregiver whether you should take a calcium supplement or vitamin D to reduce the rate of osteoporosis.  Menopause can be associated with physical symptoms and risks. Hormone replacement therapy is available to decrease symptoms and risks. You should talk to your caregiver about whether hormone replacement therapy is right for you.  Use sunscreen. Apply sunscreen liberally and repeatedly throughout the day. You should seek shade when your shadow is shorter than you. Protect yourself by wearing long sleeves, pants, a wide-brimmed hat, and sunglasses year round, whenever you are outdoors.  Once a month, do a whole body skin exam, using a mirror to look at the skin on your back. Notify your caregiver of new moles, moles that have irregular borders, moles that are larger than a pencil eraser, or moles that have changed in shape or color.  Stay current with required immunizations.  Influenza vaccine. All adults should be immunized every year.  Tetanus, diphtheria, and acellular pertussis (Td, Tdap) vaccine. Pregnant women should receive 1 dose of Tdap vaccine during each pregnancy. The dose should be obtained regardless of the length of time since the last dose. Immunization is preferred during the 27th to 36th  week of gestation. An adult who has not previously received Tdap or who does not know her vaccine status should receive 1 dose of Tdap. This initial dose should be followed by tetanus and diphtheria toxoids (Td) booster doses every 10 years. Adults with an unknown or incomplete history of completing a 3-dose immunization series with Td-containing vaccines should begin or complete a primary immunization series including a Tdap dose. Adults should receive a Td booster every 10 years.  Varicella vaccine. An adult without evidence of immunity to varicella should receive 2 doses or a second dose if she has previously received 1 dose. Pregnant females who do not have evidence of immunity should receive the first dose after pregnancy. This first dose should be obtained before leaving the health care facility. The second dose should be obtained 4 8 weeks after the first dose.  Human papillomavirus (HPV) vaccine. Females aged 59 26 years who have not received the vaccine previously should obtain the 3-dose series. The vaccine is not recommended for use in pregnant females. However, pregnancy testing is not needed before receiving a dose. If a female is found to be pregnant after receiving a dose, no treatment is needed. In that case, the remaining doses should be delayed until after the pregnancy. Immunization is recommended for any person with an immunocompromised condition through the age of 36 years if she did not get any or all doses earlier. During the 3-dose series, the second dose should be obtained 4 8 weeks after the first dose. The third dose should be obtained 24 weeks after the first dose and 16 weeks after the second dose.  Zoster vaccine. One dose is recommended for adults aged 31 years or older unless certain conditions are present.  Measles, mumps, and rubella (MMR) vaccine. Adults born before [birth year] generally are considered immune to measles and mumps. Adults born in [birth year] or later should have 1 or more  doses of MMR vaccine unless there is a contraindication to the vaccine or there is laboratory evidence of immunity to each of the three diseases. A routine second dose of MMR vaccine should be obtained at least 28 days after the  first dose for students attending postsecondary schools, health care workers, or international travelers. People who received inactivated measles vaccine or an unknown type of measles vaccine during 1963 1967 should receive 2 doses of MMR vaccine. People who received inactivated mumps vaccine or an unknown type of mumps vaccine before 1979 and are at high risk for mumps infection should consider immunization with 2 doses of MMR vaccine. For females of childbearing age, rubella immunity should be determined. If there is no evidence of immunity, females who are not pregnant should be vaccinated. If there is no evidence of immunity, females who are pregnant should delay immunization until after pregnancy. Unvaccinated health care workers born before [birth year] who lack laboratory evidence of measles, mumps, or rubella immunity or laboratory confirmation of disease should consider measles and mumps immunization with 2 doses of MMR vaccine or rubella immunization with 1 dose of MMR vaccine.  Pneumococcal 13-valent conjugate (PCV13) vaccine. When indicated, a person who is uncertain of her immunization history and has no record of immunization should receive the PCV13 vaccine. An adult aged 58 years or older who has certain medical conditions and has not been previously immunized should receive 1 dose of PCV13 vaccine. This PCV13 should be followed with a dose of pneumococcal polysaccharide (PPSV23) vaccine. The PPSV23 vaccine dose should be obtained at least 8 weeks after the dose of PCV13 vaccine. An adult aged 61 years or older who has certain medical conditions and previously received 1 or more doses of PPSV23 vaccine should receive 1 dose of PCV13. The PCV13 vaccine dose should be obtained 1 or  more years after the last PPSV23 vaccine dose.  Pneumococcal polysaccharide (PPSV23) vaccine. When PCV13 is also indicated, PCV13 should be obtained first. All adults aged 32 years and older should be immunized. An adult younger than age 47 years who has certain medical conditions should be immunized. Any person who resides in a nursing home or long-term care facility should be immunized. An adult smoker should be immunized. People with an immunocompromised condition and certain other conditions should receive both PCV13 and PPSV23 vaccines. People with human immunodeficiency virus (HIV) infection should be immunized as soon as possible after diagnosis. Immunization during chemotherapy or radiation therapy should be avoided. Routine use of PPSV23 vaccine is not recommended for American Indians, [place] Natives, or people younger than 65 years unless there are medical conditions that require PPSV23 vaccine. When indicated, people who have unknown immunization and have no record of immunization should receive PPSV23 vaccine. One-time revaccination 5 years after the first dose of PPSV23 is recommended for people aged 54 64 years who have chronic kidney failure, nephrotic syndrome, asplenia, or immunocompromised conditions. People who received 1 2 doses of PPSV23 before age 84 years should receive another dose of PPSV23 vaccine at age 74 years or later if at least 5 years have passed since the previous dose. Doses of PPSV23 are not needed for people immunized with PPSV23 at or after age 5 years.  Meningococcal vaccine. Adults with asplenia or persistent complement component deficiencies should receive 2 doses of quadrivalent meningococcal conjugate (MenACWY-D) vaccine. The doses should be obtained at least 2 months apart. Microbiologists working with certain meningococcal bacteria, [employer] recruits, people at risk during an outbreak, and people who travel to or live in countries with a high rate of meningitis  should be immunized. A first-year college student up through age 29 years who is living in a residence hall should receive a dose if she did not receive  a dose on or after her 16th birthday. Adults who have certain high-risk conditions should receive one or more doses of vaccine.  Hepatitis A vaccine. Adults who wish to be protected from this disease, have certain high-risk conditions, work with hepatitis A-infected animals, work in hepatitis A research labs, or travel to or work in countries with a high rate of hepatitis A should be immunized. Adults who were previously unvaccinated and who anticipate close contact with an international adoptee during the first 60 days after arrival in the [place] from a country with a high rate of hepatitis A should be immunized.  Hepatitis B vaccine. Adults who wish to be protected from this disease, have certain high-risk conditions, may be exposed to blood or other infectious body fluids, are household contacts or sex partners of hepatitis B positive people, are clients or workers in certain care facilities, or travel to or work in countries with a high rate of hepatitis B should be immunized.  Haemophilus influenzae type b (Hib) vaccine. A previously unvaccinated person with asplenia or sickle cell disease or having a scheduled splenectomy should receive 1 dose of Hib vaccine. Regardless of previous immunization, a recipient of a hematopoietic stem cell transplant should receive a 3-dose series 6 12 months after her successful transplant. Hib vaccine is not recommended for adults with HIV infection. Preventive Services / Frequency Ages 49 to 76  Blood pressure check.** / Every 1 to 2 years.  Lipid and cholesterol check.** / Every 5 years beginning at age 71.  Clinical breast exam.** / Every 3 years for women in their 81s and 29s.  BRCA-related cancer risk assessment.** / For women who have family members with a BRCA-related cancer (breast, ovarian, tubal,  or peritoneal cancers).  Pap test.** / Every 2 years from ages 3 through 77. Every 3 years starting at age 60 through age 52 or 66 with a history of 3 consecutive normal Pap tests.  HPV screening.** / Every 3 years from ages 45 through ages 39 to 80 with a history of 3 consecutive normal Pap tests.  Hepatitis C blood test.** / For any individual with known risks for hepatitis C.  Skin self-exam. / Monthly.  Influenza vaccine. / Every year.  Tetanus, diphtheria, and acellular pertussis (Tdap, Td) vaccine.** / Consult your caregiver. Pregnant women should receive 1 dose of Tdap vaccine during each pregnancy. 1 dose of Td every 10 years.  Varicella vaccine.** / Consult your caregiver. Pregnant females who do not have evidence of immunity should receive the first dose after pregnancy.  HPV vaccine. / 3 doses over 6 months, if 58 and younger. The vaccine is not recommended for use in pregnant females. However, pregnancy testing is not needed before receiving a dose.  Measles, mumps, rubella (MMR) vaccine.** / You need at least 1 dose of MMR if you were born in 1957 or later. You may also need a 2nd dose. For females of childbearing age, rubella immunity should be determined. If there is no evidence of immunity, females who are not pregnant should be vaccinated. If there is no evidence of immunity, females who are pregnant should delay immunization until after pregnancy.  Pneumococcal 13-valent conjugate (PCV13) vaccine.** / Consult your caregiver.  Pneumococcal polysaccharide (PPSV23) vaccine.** / 1 to 2 doses if you smoke cigarettes or if you have certain conditions.  Meningococcal vaccine.** / 1 dose if you are age 68 to 60 years and a Market researcher living in a residence hall, or have one  of several medical conditions, you need to get vaccinated against meningococcal disease. You may also need additional booster doses.  Hepatitis A vaccine.** / Consult your  caregiver.  Hepatitis B vaccine.** / Consult your caregiver.  Haemophilus influenzae type b (Hib) vaccine.** / Consult your caregiver. ** Family history and personal history of risk and conditions may change your caregiver's recommendations. Document Released: 10/01/2001 Document Revised: 11/30/2012 Document Reviewed: 12/31/2010 [facility] Patient Information 2014 [place], [place].  Morton's Neuroma Neuralgia (nerve pain) or neuroma (benign [non-cancerous] nerve tumor) may develop on any interdigital nerve. The interdigital nerves (nerves between digits) of the foot travel beneath and between the metatarsals (long bones of the fore foot) and pass the nerve endings to the toes. The third interdigital is a common place for a small neuroma to form called Morton's neuroma. Another nerve to be affected commonly is the fourth interdigital nerve. This would be in approximately in the area of the base or ball under the bottom of your fourth toe. This condition occurs more commonly in women and is usually on one side. It is usually first noticed by pain radiating (spreading) to the ball of the foot or to the toes. CAUSES The cause of interdigital neuralgia may be from low grade repetitive trauma (damage caused by an accident) as in activities causing a repeated pounding of the foot (running, jumping etc.). It is also caused by improper footwear or recent loss of the fatty padding on the bottom of the foot. TREATMENT  The condition often resolves (goes away) simply with decreasing activity if that is thought to be the cause. Proper shoes are beneficial. Orthotics (special foot support aids) such as a metatarsal bar are often beneficial. This condition usually responds to conservative therapy, however if surgery is necessary it usually brings complete relief. HOME CARE INSTRUCTIONS   Apply ice to the area of soreness for 15-20 minutes, 03-04 times per day, while awake for the first 2 days. Put ice in a plastic  bag and place a towel between the bag of ice and your skin.  Only take over-the-counter or prescription medicines for pain, discomfort, or fever as directed by your caregiver. MAKE SURE YOU:   Understand these instructions.  Will watch your condition.  Will get help right away if you are not doing well or get worse. Document Released: 11/11/2000 Document Revised: 10/28/2011 Document Reviewed: 08/05/2005 [facility] Patient Information 2014 [place].

## 2013-11-27 LAB — VITAMIN D 25 HYDROXY (VIT D DEFICIENCY, FRACTURES): Vit D, 25-Hydroxy: 29 ng/mL — ABNORMAL LOW (ref 30–89)

## 2013-11-30 ENCOUNTER — Encounter: Payer: Self-pay | Admitting: Nurse Practitioner

## 2013-11-30 DIAGNOSIS — R202 Paresthesia of skin: Secondary | ICD-10-CM

## 2013-11-30 DIAGNOSIS — G5762 Lesion of plantar nerve, left lower limb: Secondary | ICD-10-CM

## 2013-11-30 DIAGNOSIS — N200 Calculus of kidney: Secondary | ICD-10-CM | POA: Insufficient documentation

## 2013-11-30 DIAGNOSIS — R32 Unspecified urinary incontinence: Secondary | ICD-10-CM | POA: Insufficient documentation

## 2013-11-30 DIAGNOSIS — Z0000 Encounter for general adult medical examination without abnormal findings: Secondary | ICD-10-CM | POA: Insufficient documentation

## 2013-11-30 HISTORY — DX: Paresthesia of skin: R20.2

## 2013-11-30 HISTORY — DX: Lesion of plantar nerve, left lower limb: G57.62

## 2013-11-30 NOTE — Assessment & Plan Note (Addendum)
Parasthesia bilat wrist to fingertip.  Denies weakness or lack of coordination. DD: B12 deficiency, anemia, radicular pain, neuropathy, MS B12, CBC, CMet,  If all labs NML will refer to neuro for Eval.

## 2013-11-30 NOTE — Assessment & Plan Note (Signed)
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 Denies weakness or lack of coordination. Few mild varicosities below knees. DD: B12 deficiency, anemia, PVD, PAD, radicular pain, neuropathy, MS B12, CBC, CMet,  If all labs NML will refer to neuro for Eval.

## 2013-11-30 NOTE — Assessment & Plan Note (Addendum)
Vaccines: Tdap UTD. Declines flu. Sees gyn (dr Matthew Saras) for [facility]: Pap 7/14 Nml, no Hx abnml Plan MMg at 40. She had L diagnostic MMG 7/14: 3 calcific spots. recmd MMG at 40.  Screening labs today: CBC, A1C, TSH, free T4, Vit D, CMET, HIV

## 2013-12-01 NOTE — Progress Notes (Signed)
Subjective:     Kristina Pierce is a 38 y.o. female and is here to establish care. The patient reports problems - tingling, numbness, and pain in bilat feet radiating up backs of legs; and bilat hands from wrists to finger tips. Onset 2 years ago. Symptoms wax & wane. She has periods of pain that lasts up to 2 weeks, then has several days of no pain. L foot pain is worse than R & causes her to limp when severe. She denies weakness or lack of coordination in hands nor feet. She saw orthopedic surgeon (Dr Berenice Primas) for L foot pain. She was dx'd with Morton's neuroma. A trial of systemic prednisone was recommended. Pt did not finish course due to SE of medication. Steroid injection was discussed as well. In addition, she has a Hx os multiple kidney stones (9) and urinary incontinence post pregnancy for which she is followed by urology.  She receives [employer] from gyn/ob [employer]) She reports no abnml paps. Pap UTD.  History   Social History  . Marital Status: Married    Spouse Name: N/A    Number of Children: 1  . Years of Education: Master   Occupational History  . 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 Smoking status: Never Smoker   . Smokeless tobacco: Never Used  . Alcohol Use: No  . Drug Use: No  . Sexual Activity: Yes    Birth Control/ Protection: Pill   Other Topics Concern  . Not on file   Social History Narrative   [name] lives with her husband & daughter. She works Medical laboratory scientific officer at [employer], Research scientist (medical).   Health Maintenance  Topic Date Due  . Influenza Vaccine  03/19/2014  . Pap Smear  03/01/2016  . Tetanus/tdap  11/30/2021    The following portions of the patient's history were reviewed and updated as appropriate: allergies, current medications, past family history, past medical history, past social history, past surgical history and problem list.  Review of Systems Constitutional: negative for chills, fatigue and  fevers Eyes: positive for trouble seeing at night-wears glasses for night driving Ears, nose, mouth, throat, and face: negative for nasal congestion, sore throat and voice change Respiratory: negative for cough, dyspnea on exertion, sputum and wheezing Cardiovascular: negative for chest pain, exertional chest pressure/discomfort, irregular heart beat and lower extremity edema Gastrointestinal: negative for abdominal pain, change in bowel habits, constipation and diarrhea Genitourinary:positive for urinary incontinence and stones Integument/breast: neg skin, calcific areas breast-rec f/u is MMG at 40. Musculoskeletal:positive for myalgias Neurological: positive for paresthesia Behavioral/Psych: negative for decreased appetite, excessive alcohol consumption, illegal drug usage, sleep disturbance and tobacco use   Objective:    BP 100/70  Pulse 74  Temp(Src) 97.8 F (36.6 C) (Oral)  Ht 5\' 6"  (1.676 m)  Wt 139 lb (63.05 kg)  BMI 22.45 kg/m2  SpO2 97%  LMP 11/17/2013 General appearance: alert, cooperative, appears stated age and mild distress Head: Normocephalic, without obvious abnormality, atraumatic Eyes: negative findings: lids and lashes normal, conjunctivae and sclerae normal, corneas clear and pupils equal, round, reactive to light and accomodation Ears: normal TM's and external ear canals both ears Throat: lips, mucosa, and tongue normal; teeth and gums normal Neck: no adenopathy, no carotid bruit, supple, symmetrical, trachea midline and thyroid not enlarged, symmetric, no tenderness/mass/nodules Lungs: clear to auscultation bilaterally Heart: regular rate and rhythm, S1, S2 normal, no murmur, click, rub or gallop Abdomen: soft, non-tender;  bowel sounds normal; no masses,  no organomegaly Extremities: no generalized edema, mild varicosities bilat LE, swollen area L foot proximal to 2-4 web spaces. tender to palpate, pedal pulses +2 bilat. Pulses: 2+ and symmetric Skin: ruddy &  thickened appearance of facial skin, o/w nml. Lymph nodes: Cervical, supraclavicular, and axillary nodes normal. Neurologic: Motor: grossly normal Coordination: normal Gait: Normal    Assessment & Plan:     1. Preventative health care Vaccines & pap UTD - CBC with Differential - Comprehensive metabolic panel - Lipid panel - T4, free - TSH - Vit D  25 hydroxy (rtn osteoporosis monitoring) - Hemoglobin A1c - HIV antibody  2. Paresthesia of both hands - Vitamin B12 - Ambulatory referral to Neurology  3. Paresthesia of bilateral legs - Vitamin B12 - Ambulatory referral to Neurology  4. Urinary incontinence F/u urology reinforced kegel, bladder training  5. Kidney stones F/u urology Discussed avoidance of calcium supplements & foods w/ Ca oxylate  6. Morton's neuroma of left foot F/u w/ Dr Berenice Primas or discuss w/neuro.   See problem list for complete A&P. F/u to be determined by labs.

## 2013-12-21 ENCOUNTER — Encounter: Payer: Self-pay | Admitting: *Deleted

## 2014-01-20 ENCOUNTER — Encounter: Payer: Self-pay | Admitting: Nurse Practitioner

## 2014-06-20 ENCOUNTER — Encounter: Payer: Self-pay | Admitting: Nurse Practitioner

## 2014-08-09 ENCOUNTER — Other Ambulatory Visit: Payer: Self-pay | Admitting: Obstetrics and Gynecology

## 2014-08-10 LAB — CYTOLOGY - PAP

## 2016-08-14 ENCOUNTER — Ambulatory Visit (HOSPITAL_BASED_OUTPATIENT_CLINIC_OR_DEPARTMENT_OTHER)
Admission: RE | Admit: 2016-08-14 | Discharge: 2016-08-14 | Disposition: A | Discharge status: Home / Self Care | Payer: BC Managed Care – PPO | Source: Ambulatory Visit | Attending: Family Medicine | Admitting: Family Medicine

## 2016-08-14 ENCOUNTER — Ambulatory Visit (INDEPENDENT_AMBULATORY_CARE_PROVIDER_SITE_OTHER): Payer: BC Managed Care – PPO | Admitting: Family Medicine

## 2016-08-14 ENCOUNTER — Encounter: Payer: Self-pay | Admitting: Family Medicine

## 2016-08-14 ENCOUNTER — Telehealth: Payer: Self-pay | Admitting: Family Medicine

## 2016-08-14 VITALS — BP 104/69 | HR 76 | Temp 98.4°F | Resp 20 | Ht 66.0 in | Wt 149.8 lb

## 2016-08-14 DIAGNOSIS — J209 Acute bronchitis, unspecified: Secondary | ICD-10-CM | POA: Diagnosis not present

## 2016-08-14 DIAGNOSIS — R059 Cough, unspecified: Secondary | ICD-10-CM

## 2016-08-14 DIAGNOSIS — R05 Cough: Secondary | ICD-10-CM | POA: Insufficient documentation

## 2016-08-14 MED ORDER — AMOXICILLIN-POT CLAVULANATE 875-125 MG PO TABS: 1.0000 | ORAL_TABLET | Freq: Two times a day (BID) | ORAL | 0 refills | Status: DC

## 2016-08-14 NOTE — Progress Notes (Addendum)
Kristina Pierce , 1976/08/05, 40 y.o., female MRN: [id number] Patient Care Team    Relationship Specialty Notifications Start End  [name], DO PCP - General Family Medicine  08/14/16   [name], MD Consulting Physician Urology  08/14/16     CC: URI  Subjective: Pt presents for an acute OV with complaints of congestion of  6 days duration.  Associated symptoms include barky cough, sinus pressure, ear pressure, headache x1 and fatigue.  She denies fever, chills, nausea, diarrhea or vomit.  Pt feels symptoms are not improving and now worsening.  Pt has tried tylenol  to ease their symptoms.  She is a Pharmacist, hospital.   Allergies  Allergen Reactions  . Goat Milk Hives  . Goat-Derived Metallurgist  . Chlorhexidine Itching and Rash   Social History  Substance Use Topics  . Smoking status: Never Smoker  . Smokeless tobacco: Never Used  . Alcohol use No   Past Medical History:  Diagnosis Date  . Allergy   . Chicken pox   . Kidney stones   . Migraine   . Neuropathy ([place])   . Urine incontinence    Past Surgical History:  Procedure Laterality Date  . APPENDECTOMY  2012  . kidney stone removal  2011  . TONSILLECTOMY  1996  . WISDOM TOOTH EXTRACTION     Family History  Problem Relation Age of Onset  . Dystonia Mother     cervical dystonia from trauma  . Kidney disease Mother     stones  . Breast cancer Mother   . Arthritis Mother   . Hypertension Father   . Hyperlipidemia Father   . Other Father     amyloidosis  . Hyperlipidemia Sister   . Kidney disease Sister     stones  . Other Sister     hearing loss, etio?  . ALS Paternal Uncle   . Multiple sclerosis Paternal Uncle   . Diabetes Paternal Uncle   . Kidney cancer Maternal Grandfather   . Heart disease Paternal Grandmother   . Heart disease Paternal Grandfather   . Colon cancer Maternal Grandmother    Allergies as of 08/14/2016      Reactions   Goat Milk Hives   Goat-derived Products Hives   Chlorhexidine Itching, Rash      Medication List       Accurate as of 08/14/16  7:16 PM. Always use your most recent med list.          amoxicillin-clavulanate 875-125 MG tablet Commonly known as:  AUGMENTIN Take 1 tablet by mouth 2 (two) times daily.   FLOMAX PO Take by mouth.   Hydrocodone-Acetaminophen 5-300 MG Tabs   ondansetron 4 MG tablet Commonly known as:  ZOFRAN       No results found for this or any previous visit (from the past 24 hour(s)). Dg Chest 2 View  Result Date: 08/14/2016 CLINICAL DATA:  Cough and congestion for a week EXAM: CHEST  2 VIEW COMPARISON:  03/14/2007 FINDINGS: The heart size and mediastinal contours are within normal limits. Both lungs are clear. The visualized skeletal structures are unremarkable. IMPRESSION: No active cardiopulmonary disease. Electronically Signed   By: Kathreen Devoid   On: 08/14/2016 09:26     ROS: Negative, with the exception of above mentioned in HPI   Objective:  BP 104/69 (BP Location: Left Arm, Patient Position: Sitting, Cuff Size: Normal)   Pulse 76   Temp 98.4 F (36.9 C) (Oral)   Resp  20   Ht 5\' 6"  (1.676 m)   Wt 149 lb 12 oz (67.9 kg)   LMP 07/21/2016   SpO2 97%   BMI 24.17 kg/m  Body mass index is 24.17 kg/m. Gen: Afebrile. No acute distress. Nontoxic in appearance, well developed, well nourished.  HENT: AT. [place]. Bilateral TM visualized WNL. MMM, no oral lesions. Bilateral nares erythema, drainage. Throat without erythema or exudates. PND, barking cough and hoarseness present. No TTP sinus  Eyes:Pupils Equal Round Reactive to light, Extraocular movements intact,  Conjunctiva without redness, discharge or icterus. Neck/lymp/endocrine: Supple,bilateral ant cervical  lymphadenopathy CV: RRR Chest: Rhonchi present, RLL crackle and wheeze.  Good air movement, normal resp effort.  Abd: Soft. NTND. BS present.  Skin: no rashes, purpura or petechiae.  Neuro: Normal gait. PERLA. EOMi. Alert. Oriented x3    Assessment/Plan: Kristina Pierce is a 40 y.o. female present for acute OV for URI illness.  Cough/Acute bronchitis, unspecified organism - rest, hydrate, tylenol - DG Chest 2 View; Future - amoxicillin-clavulanate (AUGMENTIN) 875-125 MG tablet; Take 1 tablet by mouth 2 (two) times daily.  Dispense: 20 tablet; Refill:0 - F/U dependant on xray and clinical improvement.    electronically signed by:  Howard Pouch, DO  [place]

## 2016-08-14 NOTE — Telephone Encounter (Signed)
Please call pt: Her cxr does not show evidence of pneumonia. Continue therapy as discussed and f/u in 2 weeks of not improved.

## 2016-08-14 NOTE — Telephone Encounter (Signed)
Left message with xray results and instructions on patient voice mail.

## 2016-08-14 NOTE — Patient Instructions (Signed)
Pleasure meeting you today.  Please have cxr completed and we will call you with results.  I have called in Augmentin  And you can safely use mucinex as well.   Rest and fluids

## 2017-08-19 NOTE — L&D Delivery Note (Signed)
Delivery Note  I was called at told patient SROM and 8 cm and did not need to come in yet and they would call when pt complete.  3 minutes later, was called and told patient delivered preciptiously. On arrival,  SVD viable female Apgars 9,9 over 2nd degree ML lac.  Placenta delivered spontaneously intact with 3VC. Repair with 2-0 Chromic with good support and hemostasis noted.  R/V exam confirms.  PH art was sent.   Mother and baby to couplet care and are doing well.  EBL 200cc  Louretta Shorten, MD

## 2017-12-17 ENCOUNTER — Inpatient Hospital Stay (HOSPITAL_COMMUNITY)
Admission: AD | Admit: 2017-12-17 | Discharge: 2017-12-17 | Disposition: A | Discharge status: Home Health | Payer: BC Managed Care – PPO | Source: Ambulatory Visit | Attending: Obstetrics and Gynecology | Admitting: Obstetrics and Gynecology

## 2017-12-17 ENCOUNTER — Encounter (HOSPITAL_COMMUNITY): Payer: Self-pay

## 2017-12-17 ENCOUNTER — Inpatient Hospital Stay (HOSPITAL_COMMUNITY): Payer: BC Managed Care – PPO

## 2017-12-17 DIAGNOSIS — Z3A26 26 weeks gestation of pregnancy: Secondary | ICD-10-CM | POA: Diagnosis not present

## 2017-12-17 DIAGNOSIS — R109 Unspecified abdominal pain: Secondary | ICD-10-CM | POA: Diagnosis not present

## 2017-12-17 DIAGNOSIS — R102 Pelvic and perineal pain: Secondary | ICD-10-CM | POA: Diagnosis present

## 2017-12-17 DIAGNOSIS — R319 Hematuria, unspecified: Secondary | ICD-10-CM | POA: Insufficient documentation

## 2017-12-17 DIAGNOSIS — R300 Dysuria: Secondary | ICD-10-CM | POA: Diagnosis not present

## 2017-12-17 DIAGNOSIS — O26892 Other specified pregnancy related conditions, second trimester: Secondary | ICD-10-CM | POA: Insufficient documentation

## 2017-12-17 DIAGNOSIS — O9989 Other specified diseases and conditions complicating pregnancy, childbirth and the puerperium: Secondary | ICD-10-CM

## 2017-12-17 LAB — URINALYSIS, ROUTINE W REFLEX MICROSCOPIC
BILIRUBIN URINE: NEGATIVE
Glucose, UA: NEGATIVE mg/dL
Ketones, ur: NEGATIVE mg/dL
Nitrite: NEGATIVE
PROTEIN: NEGATIVE mg/dL
SPECIFIC GRAVITY, URINE: 1.005 (ref 1.005–1.030)
pH: 8 (ref 5.0–8.0)

## 2017-12-17 MED ORDER — PHENAZOPYRIDINE HCL 200 MG PO TABS: 200.0000 mg | ORAL_TABLET | Freq: Three times a day (TID) | ORAL | 0 refills | Status: DC

## 2017-12-17 MED ORDER — CEPHALEXIN 500 MG PO CAPS: 500.0000 mg | ORAL_CAPSULE | Freq: Four times a day (QID) | ORAL | 0 refills | Status: DC

## 2017-12-17 MED ORDER — ACETAMINOPHEN 500 MG PO TABS
1000.0000 mg | ORAL_TABLET | Freq: Once | ORAL | Status: AC
  Administered 2017-12-17: 1000 mg via ORAL
  Filled 2017-12-17: qty 2

## 2017-12-17 MED ORDER — TAMSULOSIN HCL 0.4 MG PO CAPS
0.4000 mg | ORAL_CAPSULE | Freq: Once | ORAL | Status: AC
  Administered 2017-12-17: 0.4 mg via ORAL
  Filled 2017-12-17: qty 1

## 2017-12-17 MED ORDER — OXYCODONE-ACETAMINOPHEN 5-325 MG PO TABS: 2.0000 | ORAL_TABLET | ORAL | 0 refills | Status: AC | PRN

## 2017-12-17 NOTE — MAU Provider Note (Signed)
History     CSN: [id number]  Arrival date and time: 12/17/17 0258   First Provider Initiated Contact with Patient 12/17/17 0321      Chief Complaint  Patient presents with  . Pelvic Pain  . Back Pain  . Urinary Frequency   HPI  Ms.  Kristina Pierce is a 42 y.o. year old [id number] female at [redacted]w[redacted]d weeks gestation who presents to MAU reporting when she woke up at 2230 with a lot of pelvic and RT sided back pain. She states that when she went the BR the pain intensified, She states it hurts when she pees and she is only "dribbling a small amount" when she goes and she is going frequently. She tried to drink water and return back to bed, but the pain was so intense that she was not able to get comfortable. When she called the after hours RN from her OB office, she was told to come to MAU for evaluation.  Past Medical History:  Diagnosis Date  . Allergy   . Chicken pox   . Kidney stones   . Migraine   . Neuropathy   . Urine incontinence     Past Surgical History:  Procedure Laterality Date  . APPENDECTOMY  2012  . kidney stone removal  2011  . TONSILLECTOMY  1996  . WISDOM TOOTH EXTRACTION      Family History  Problem Relation Age of Onset  . Dystonia Mother        cervical dystonia from trauma  . Kidney disease Mother        stones  . Breast cancer Mother   . Arthritis Mother   . Hypertension Father   . Hyperlipidemia Father   . Other Father        amyloidosis  . Hyperlipidemia Sister   . Kidney disease Sister        stones  . Other Sister        hearing loss, etio?  . ALS Paternal Uncle   . Multiple sclerosis Paternal Uncle   . Diabetes Paternal Uncle   . Kidney cancer Maternal Grandfather   . Heart disease Paternal Grandmother   . Heart disease Paternal Grandfather   . Colon cancer Maternal Grandmother     Social History   Tobacco Use  . Smoking status: Never Smoker  . Smokeless tobacco: Never Used  Substance Use Topics  . Alcohol use: No  . Drug  use: No    Allergies:  Allergies  Allergen Reactions  . Goat Milk Hives  . Goat-Derived Metallurgist  . Chlorhexidine Itching and Rash    Medications Prior to Admission  Medication Sig Dispense Refill Last Dose  . amoxicillin-clavulanate (AUGMENTIN) 875-125 MG tablet Take 1 tablet by mouth 2 (two) times daily. 20 tablet 0   . Hydrocodone-Acetaminophen 5-300 MG TABS    More than a month at Unknown time  . ondansetron (ZOFRAN) 4 MG tablet    More than a month at Unknown time  . Tamsulosin HCl (FLOMAX PO) Take by mouth.   More than a month at Unknown time    Review of Systems  Constitutional: Negative.   HENT: Negative.   Eyes: Negative.   Respiratory: Negative.   Cardiovascular: Negative.   Gastrointestinal: Negative.   Endocrine: Negative.   Genitourinary: Positive for dysuria, flank pain, frequency, pelvic pain and urgency.  Musculoskeletal: Positive for back pain.  Skin: Negative.   Allergic/Immunologic: Negative.   Neurological: Negative.   Hematological: Negative.  Psychiatric/Behavioral: Negative.    Physical Exam   Blood pressure 114/73, pulse 83, temperature 97.6 F (36.4 C), height 5\' 5"  (1.651 m), weight 160 lb (72.6 kg), SpO2 99 %.  Physical Exam  Nursing note and vitals reviewed. Constitutional: She is oriented to person, place, and time. She appears well-developed and well-nourished.  HENT:  Head: Normocephalic and atraumatic.  Eyes: Pupils are equal, round, and reactive to light.  Neck: Normal range of motion.  Cardiovascular: Normal rate, regular rhythm and normal heart sounds.  Respiratory: Effort normal and breath sounds normal.  GI: Soft. Bowel sounds are normal. There is CVA tenderness (RT side).  Genitourinary:  Genitourinary Comments: Dilation: Closed Effacement (%): Thick Cervical Position: Posterior Station: Ballotable Presentation: Undeterminable Exam by: Sunday Corn, CNM   Musculoskeletal: Normal range of motion.  Neurological: She is  alert and oriented to person, place, and time.  Skin: Skin is warm and dry.  Psychiatric: She has a normal mood and affect. Her behavior is normal. Judgment and thought content normal.    MAU Course  Procedures  MDM CCUA UCx -- pending Renal U/S Tylenol 1000 mg po -- minimal improvement Flomax 0.4 mg  NST - FHR: 135 bpm / moderate variability / accels present / decels absent / TOCO: irregular UC's every 2-6 mins  Results for orders placed or performed during the hospital encounter of 12/17/17 (from the past 24 hour(s))  Urinalysis, Routine w reflex microscopic     Status: Abnormal   Collection Time: 12/17/17  1:42 AM  Result Value Ref Range   Color, Urine STRAW (A) YELLOW   APPearance CLEAR CLEAR   Specific Gravity, Urine 1.005 1.005 - 1.030   pH 8.0 5.0 - 8.0   Glucose, UA NEGATIVE NEGATIVE mg/dL   Hgb urine dipstick LARGE (A) NEGATIVE   Bilirubin Urine NEGATIVE NEGATIVE   Ketones, ur NEGATIVE NEGATIVE mg/dL   Protein, ur NEGATIVE NEGATIVE mg/dL   Nitrite NEGATIVE NEGATIVE   Leukocytes, UA TRACE (A) NEGATIVE   RBC / HPF 21-50 0 - 5 RBC/hpf   WBC, UA 0-5 0 - 5 WBC/hpf   Bacteria, UA FEW (A) NONE SEEN   Squamous Epithelial / LPF 0-5 0 - 5   Mucus PRESENT   US Renal  Result Date: 12/17/2017 CLINICAL DATA:  Pelvic and low back pain. Twenty-six weeks pregnant. History of kidney stones. EXAM: RENAL / URINARY TRACT ULTRASOUND COMPLETE COMPARISON:  None. FINDINGS: Right Kidney: Length: 12.5 cm. Mild hydronephrosis is demonstrated pre and postvoid. No stones are identified but the entire ureter is not visualized. Normal parenchymal thickness and echotexture. No focal masses. Left Kidney: Length: 12.1 cm. Echogenicity within normal limits. No mass or hydronephrosis visualized. Bladder: No bladder wall thickening or filling defects. Color flow Doppler images obtained of the bladder demonstrate a left urine flow jets. No right flow jet is identified. This is of nonspecific significance.  IMPRESSION: 1. Mild right hydronephrosis. No stones are identified. In the setting of pregnancy, hydronephrosis may be caused by pressure on the ureter from the pregnancy. 2. Normal appearance of the left kidney and bladder. Electronically Signed   By: Lucienne Capers M.D.   On: 12/17/2017 04:21     Assessment and Plan  Hematuria  - Information provided on hematuria   Dysuria  - Rx Keflex 500 QID x 7 days & Percocet 5/325 mg // advised to not take Tylnol and Percocet at same time - Information provided on dysuria - F/U with [facility] as scheduled  - Patient  verbalized an understanding of the plan of care and agrees.   Laury Deep, MSN, CNM 12/17/2017, 3:21 AM

## 2017-12-17 NOTE — MAU Note (Signed)
Pt reports waking upon at 10:30p with a lot of pelvic and right sided back pain. States she went to the bathroom and the pain intensified. States it hurts when she pees and is going frequently. States she only had small amounts of urine. States she drank water, and tried to go back to sleep. Was not able to go back to sleep and called the on call nurse and was told to come in. Pt denies vaginal bleeding.

## 2017-12-17 NOTE — Discharge Instructions (Signed)
Your last dose of Tylenol was at 3:45 AM. You can take Percocet 5/325 2 tablets no sooner than 9:45 AM this morning due to Percocet containing Tylenol.  If your symptoms are worsened or not improved, please call [facility].

## 2017-12-18 LAB — CULTURE, OB URINE
Culture: NO GROWTH
Special Requests: NORMAL

## 2018-03-09 ENCOUNTER — Inpatient Hospital Stay (HOSPITAL_COMMUNITY)
Admission: AD | Admit: 2018-03-09 | Discharge: 2018-03-09 | Disposition: A | Discharge status: Home / Self Care | Payer: BC Managed Care – PPO | Source: Ambulatory Visit | Attending: Obstetrics and Gynecology | Admitting: Obstetrics and Gynecology

## 2018-03-09 ENCOUNTER — Encounter (HOSPITAL_COMMUNITY): Payer: Self-pay | Admitting: *Deleted

## 2018-03-09 DIAGNOSIS — Z3483 Encounter for supervision of other normal pregnancy, third trimester: Secondary | ICD-10-CM | POA: Diagnosis present

## 2018-03-09 DIAGNOSIS — O479 False labor, unspecified: Secondary | ICD-10-CM

## 2018-03-09 NOTE — MAU Note (Signed)
PT SAYS UC STARTED AT 430PM-  VE IN OFFICE  2 CM . DENIES HSV AND MRSA. GBS- NEG

## 2018-03-15 ENCOUNTER — Encounter (HOSPITAL_COMMUNITY): Payer: Self-pay | Admitting: *Deleted

## 2018-03-15 ENCOUNTER — Inpatient Hospital Stay (HOSPITAL_COMMUNITY)
Admission: AD | Admit: 2018-03-15 | Discharge: 2018-03-16 | DRG: 807 | Disposition: A | Discharge status: Home / Self Care | Payer: BC Managed Care – PPO | Attending: Obstetrics and Gynecology | Admitting: Obstetrics and Gynecology

## 2018-03-15 ENCOUNTER — Inpatient Hospital Stay (HOSPITAL_COMMUNITY): Payer: BC Managed Care – PPO | Admitting: Anesthesiology

## 2018-03-15 DIAGNOSIS — Z3A38 38 weeks gestation of pregnancy: Secondary | ICD-10-CM | POA: Diagnosis not present

## 2018-03-15 DIAGNOSIS — Z3483 Encounter for supervision of other normal pregnancy, third trimester: Secondary | ICD-10-CM | POA: Diagnosis present

## 2018-03-15 LAB — CBC
HCT: 35.9 % — ABNORMAL LOW (ref 36.0–46.0)
HCT: 32.1 % — ABNORMAL LOW (ref 36.0–46.0)
HEMOGLOBIN: 11.2 g/dL — AB (ref 12.0–15.0)
Hemoglobin: 12.4 g/dL (ref 12.0–15.0)
MCH: 33.1 pg (ref 26.0–34.0)
MCH: 33.4 pg (ref 26.0–34.0)
MCHC: 34.5 g/dL (ref 30.0–36.0)
MCHC: 34.9 g/dL (ref 30.0–36.0)
MCV: 95.7 fL (ref 78.0–100.0)
MCV: 95.8 fL (ref 78.0–100.0)
PLATELETS: 194 10*3/uL (ref 150–400)
Platelets: 155 10*3/uL (ref 150–400)
RBC: 3.35 MIL/uL — AB (ref 3.87–5.11)
RBC: 3.75 MIL/uL — AB (ref 3.87–5.11)
RDW: 13.8 % (ref 11.5–15.5)
RDW: 13.6 % (ref 11.5–15.5)
WBC: 11.9 10*3/uL — AB (ref 4.0–10.5)
WBC: 14.7 10*3/uL — ABNORMAL HIGH (ref 4.0–10.5)

## 2018-03-15 LAB — TYPE AND SCREEN
ABO/RH(D): O POS
Antibody Screen: NEGATIVE

## 2018-03-15 LAB — ABO/RH: ABO/RH(D): O POS

## 2018-03-15 LAB — RPR: RPR: NONREACTIVE

## 2018-03-15 MED ORDER — COCONUT OIL OIL: 1.0000 "application " | TOPICAL_OIL | Status: DC | PRN

## 2018-03-15 MED ORDER — ONDANSETRON HCL 4 MG/2ML IJ SOLN: 4.0000 mg | Freq: Four times a day (QID) | INTRAMUSCULAR | Status: DC | PRN

## 2018-03-15 MED ORDER — ACETAMINOPHEN 325 MG PO TABS: 650.0000 mg | ORAL_TABLET | ORAL | Status: DC | PRN

## 2018-03-15 MED ORDER — BENZOCAINE-MENTHOL 20-0.5 % EX AERO
1.0000 "application " | INHALATION_SPRAY | CUTANEOUS | Status: DC | PRN
  Administered 2018-03-15: 1 via TOPICAL
  Filled 2018-03-15: qty 56

## 2018-03-15 MED ORDER — OXYTOCIN 40 UNITS IN LACTATED RINGERS INFUSION - SIMPLE MED
2.5000 [IU]/h | INTRAVENOUS | Status: DC
  Filled 2018-03-15: qty 1000

## 2018-03-15 MED ORDER — DIBUCAINE 1 % RE OINT: 1.0000 "application " | TOPICAL_OINTMENT | RECTAL | Status: DC | PRN

## 2018-03-15 MED ORDER — PHENYLEPHRINE 40 MCG/ML (10ML) SYRINGE FOR IV PUSH (FOR BLOOD PRESSURE SUPPORT)
80.0000 ug | PREFILLED_SYRINGE | INTRAVENOUS | Status: DC | PRN
  Filled 2018-03-15: qty 5

## 2018-03-15 MED ORDER — ZOLPIDEM TARTRATE 5 MG PO TABS: 5.0000 mg | ORAL_TABLET | Freq: Every evening | ORAL | Status: DC | PRN

## 2018-03-15 MED ORDER — LACTATED RINGERS IV SOLN: 500.0000 mL | Freq: Once | INTRAVENOUS | Status: DC

## 2018-03-15 MED ORDER — LACTATED RINGERS IV SOLN: INTRAVENOUS | Status: DC

## 2018-03-15 MED ORDER — ACETAMINOPHEN 325 MG PO TABS
650.0000 mg | ORAL_TABLET | ORAL | Status: DC | PRN
  Administered 2018-03-15 (×2): 650 mg via ORAL
  Filled 2018-03-15 (×2): qty 2

## 2018-03-15 MED ORDER — ONDANSETRON HCL 4 MG PO TABS: 4.0000 mg | ORAL_TABLET | ORAL | Status: DC | PRN

## 2018-03-15 MED ORDER — EPHEDRINE 5 MG/ML INJ
10.0000 mg | INTRAVENOUS | Status: DC | PRN
  Filled 2018-03-15: qty 2

## 2018-03-15 MED ORDER — MEASLES, MUMPS & RUBELLA VAC ~~LOC~~ INJ
0.5000 mL | INJECTION | Freq: Once | SUBCUTANEOUS | Status: DC
  Filled 2018-03-15: qty 0.5

## 2018-03-15 MED ORDER — OXYCODONE-ACETAMINOPHEN 5-325 MG PO TABS
1.0000 | ORAL_TABLET | ORAL | Status: DC | PRN
  Administered 2018-03-16 (×2): 1 via ORAL
  Filled 2018-03-15 (×2): qty 1

## 2018-03-15 MED ORDER — DIPHENHYDRAMINE HCL 25 MG PO CAPS: 25.0000 mg | ORAL_CAPSULE | Freq: Four times a day (QID) | ORAL | Status: DC | PRN

## 2018-03-15 MED ORDER — SIMETHICONE 80 MG PO CHEW: 80.0000 mg | CHEWABLE_TABLET | ORAL | Status: DC | PRN

## 2018-03-15 MED ORDER — LIDOCAINE HCL (PF) 1 % IJ SOLN
INTRAMUSCULAR | Status: DC | PRN
  Administered 2018-03-15: 3 mL via EPIDURAL
  Administered 2018-03-15: 5 mL via EPIDURAL
  Administered 2018-03-15: 2 mL via EPIDURAL

## 2018-03-15 MED ORDER — SOD CITRATE-CITRIC ACID 500-334 MG/5ML PO SOLN: 30.0000 mL | ORAL | Status: DC | PRN

## 2018-03-15 MED ORDER — OXYTOCIN BOLUS FROM INFUSION
500.0000 mL | Freq: Once | INTRAVENOUS | Status: AC
  Administered 2018-03-15: 500 mL via INTRAVENOUS

## 2018-03-15 MED ORDER — WITCH HAZEL-GLYCERIN EX PADS: 1.0000 "application " | MEDICATED_PAD | CUTANEOUS | Status: DC | PRN

## 2018-03-15 MED ORDER — IBUPROFEN 600 MG PO TABS
600.0000 mg | ORAL_TABLET | Freq: Four times a day (QID) | ORAL | Status: DC
  Administered 2018-03-15 – 2018-03-16 (×5): 600 mg via ORAL
  Filled 2018-03-15 (×6): qty 1

## 2018-03-15 MED ORDER — OXYCODONE-ACETAMINOPHEN 5-325 MG PO TABS: 1.0000 | ORAL_TABLET | ORAL | Status: DC | PRN

## 2018-03-15 MED ORDER — FLEET ENEMA 7-19 GM/118ML RE ENEM: 1.0000 | ENEMA | RECTAL | Status: DC | PRN

## 2018-03-15 MED ORDER — PRENATAL MULTIVITAMIN CH
1.0000 | ORAL_TABLET | Freq: Every day | ORAL | Status: DC
  Administered 2018-03-15 – 2018-03-16 (×2): 1 via ORAL
  Filled 2018-03-15 (×2): qty 1

## 2018-03-15 MED ORDER — LIDOCAINE HCL (PF) 1 % IJ SOLN
30.0000 mL | INTRAMUSCULAR | Status: DC | PRN
  Filled 2018-03-15: qty 30

## 2018-03-15 MED ORDER — DIPHENHYDRAMINE HCL 50 MG/ML IJ SOLN: 12.5000 mg | INTRAMUSCULAR | Status: DC | PRN

## 2018-03-15 MED ORDER — MEDROXYPROGESTERONE ACETATE 150 MG/ML IM SUSP: 150.0000 mg | INTRAMUSCULAR | Status: DC | PRN

## 2018-03-15 MED ORDER — LACTATED RINGERS IV SOLN: 500.0000 mL | INTRAVENOUS | Status: DC | PRN

## 2018-03-15 MED ORDER — FENTANYL 2.5 MCG/ML BUPIVACAINE 1/10 % EPIDURAL INFUSION (WH - ANES)
14.0000 mL/h | INTRAMUSCULAR | Status: DC | PRN
  Administered 2018-03-15: 14 mL/h via EPIDURAL
  Filled 2018-03-15: qty 100

## 2018-03-15 MED ORDER — OXYCODONE-ACETAMINOPHEN 5-325 MG PO TABS: 2.0000 | ORAL_TABLET | ORAL | Status: DC | PRN

## 2018-03-15 MED ORDER — SENNOSIDES-DOCUSATE SODIUM 8.6-50 MG PO TABS
2.0000 | ORAL_TABLET | ORAL | Status: DC
  Administered 2018-03-15: 2 via ORAL
  Filled 2018-03-15: qty 2

## 2018-03-15 MED ORDER — TETANUS-DIPHTH-ACELL PERTUSSIS 5-2.5-18.5 LF-MCG/0.5 IM SUSP: 0.5000 mL | Freq: Once | INTRAMUSCULAR | Status: DC

## 2018-03-15 MED ORDER — PHENYLEPHRINE 40 MCG/ML (10ML) SYRINGE FOR IV PUSH (FOR BLOOD PRESSURE SUPPORT)
80.0000 ug | PREFILLED_SYRINGE | INTRAVENOUS | Status: DC | PRN
  Filled 2018-03-15: qty 10
  Filled 2018-03-15: qty 5

## 2018-03-15 MED ORDER — ONDANSETRON HCL 4 MG/2ML IJ SOLN: 4.0000 mg | INTRAMUSCULAR | Status: DC | PRN

## 2018-03-15 NOTE — Anesthesia Preprocedure Evaluation (Signed)
Anesthesia Evaluation  Patient identified by MRN, date of birth, ID band Patient awake    Reviewed: Allergy & Precautions, NPO status , Patient's Chart, lab work & pertinent test results  Airway Mallampati: I  TM Distance: >3 FB Neck ROM: Full    Dental  (+) Teeth Intact, Dental Advisory Given   Pulmonary neg pulmonary ROS,    Pulmonary exam normal breath sounds clear to auscultation       Cardiovascular negative cardio ROS Normal cardiovascular exam Rhythm:Regular Rate:Normal     Neuro/Psych  Headaches,  Neuromuscular disease    GI/Hepatic negative GI ROS, Neg liver ROS,   Endo/Other  negative endocrine ROS  Renal/GU negative Renal ROS Bladder dysfunction      Musculoskeletal negative musculoskeletal ROS (+)   Abdominal   Peds  Hematology negative hematology ROS (+) Plt 194k   Anesthesia Other Findings Day of surgery medications reviewed with the patient.  Reproductive/Obstetrics (+) Pregnancy                             Anesthesia Physical Anesthesia Plan  ASA: II  Anesthesia Plan: Epidural   Post-op Pain Management:    Induction:   PONV Risk Score and Plan: 2 and Treatment may vary due to age or medical condition  Airway Management Planned: Natural Airway  Additional Equipment:   Intra-op Plan:   Post-operative Plan:   Informed Consent: I have reviewed the patients History and Physical, chart, labs and discussed the procedure including the risks, benefits and alternatives for the proposed anesthesia with the patient or authorized representative who has indicated his/her understanding and acceptance.   Dental advisory given  Plan Discussed with:   Anesthesia Plan Comments: (Patient identified. Risks/Benefits/Options discussed with patient including but not limited to bleeding, infection, nerve damage, paralysis, failed block, incomplete pain control, headache, blood  pressure changes, nausea, vomiting, reactions to medication both or allergic, itching and postpartum back pain. Confirmed with bedside nurse the patient's most recent platelet count. Confirmed with patient that they are not currently taking any anticoagulation, have any bleeding history or any family history of bleeding disorders. Patient expressed understanding and wished to proceed. All questions were answered. )        Anesthesia Quick Evaluation

## 2018-03-15 NOTE — Anesthesia Procedure Notes (Signed)
Epidural Patient location during procedure: OB Start time: 03/15/2018 1:11 AM End time: 03/15/2018 1:17 AM  Staffing Anesthesiologist: Catalina Gravel, MD Performed: anesthesiologist   Preanesthetic Checklist Completed: patient identified, pre-op evaluation, timeout performed, IV checked, risks and benefits discussed and monitors and equipment checked  Epidural Patient position: sitting Prep: DuraPrep Patient monitoring: blood pressure and continuous pulse ox Approach: midline Location: L3-L4 Injection technique: LOR air  Needle:  Needle type: Tuohy  Needle gauge: 17 G Needle length: 9 cm Needle insertion depth: 4 cm Catheter size: 19 Gauge Catheter at skin depth: 9 cm Test dose: negative and Other (1% Lidocaine)  Additional Notes Patient identified.  Risk benefits discussed including failed block, incomplete pain control, headache, nerve damage, paralysis, blood pressure changes, nausea, vomiting, reactions to medication both toxic or allergic, and postpartum back pain.  Patient expressed understanding and wished to proceed.  All questions were answered.  Sterile technique used throughout procedure and epidural site dressed with sterile barrier dressing. No paresthesia or other complications noted. The patient did not experience any signs of intravascular injection such as tinnitus or metallic taste in mouth nor signs of intrathecal spread such as rapid motor block. Please see nursing notes for vital signs. Reason for block:procedure for pain

## 2018-03-15 NOTE — Progress Notes (Signed)
Post Partum Day 0 Subjective: no complaints, up ad lib, voiding and tolerating PO  Objective: Blood pressure 108/62, pulse 85, temperature 98.3 F (36.8 C), temperature source Oral, resp. rate 18, height 5\' 6"  (1.676 m), weight 168 lb (76.2 kg), SpO2 98 %, unknown if currently breastfeeding.  Physical Exam:  General: alert, cooperative, appears stated age and no distress Lochia: appropriate Uterine Fundus: firm Incision: healing well DVT Evaluation: No evidence of DVT seen on physical exam.  Recent Labs    03/15/18 0043 03/15/18 0701  HGB 12.4 11.2*  HCT 35.9* 32.1*    Assessment/Plan: Plan for discharge tomorrow, Breastfeeding and Circumcision prior to discharge   LOS: 0 days   Jermone Geister C 03/15/2018, 9:53 AM

## 2018-03-15 NOTE — Anesthesia Postprocedure Evaluation (Signed)
Anesthesia Post Note  Patient: Kristina Pierce  Procedure(s) Performed: AN AD [place]     Patient location during evaluation: Mother Baby Anesthesia Type: Epidural Level of consciousness: awake and alert Pain management: pain level controlled Vital Signs Assessment: post-procedure vital signs reviewed and stable Respiratory status: spontaneous breathing, nonlabored ventilation and respiratory function stable Cardiovascular status: stable Postop Assessment: no headache, no backache, epidural receding and patient able to bend at knees Anesthetic complications: no    Last Vitals:  Vitals:   03/15/18 0946 03/15/18 1747  BP: 108/62 108/60  Pulse: 85 87  Resp: 18 18  Temp: 36.8 C 36.8 C  SpO2: 98% 97%    Last Pain:  Vitals:   03/15/18 1747  TempSrc: Oral  PainSc: 6    Pain Goal:                 Rayvon Char

## 2018-03-15 NOTE — Lactation Note (Signed)
This note was copied from a baby's chart. Lactation Consultation Note  Patient Name: Kristina Pierce [id number] Date: 03/15/2018 Reason for consult: Initial assessment;Early term 80-38.6wks  Visited with P2 Mom of ET infant at 71 hrs old.  Mom stated baby rooted in [place], but wasn't able to latch baby.  Baby fed for 8 min well earlier this am.  Baby unwrapped and placed STS in football hole.  Mom hand expressing colostrum into baby's mouth.  He woke, licked nipple, and opened semi-wide and latched but didn't suckle.  A few attempts made.  Reviewed and assisted Mom to hand express from both breasts, and spoon fed 2 ml of colostrum.  Mom has erect nipples and very compressible areola. Encouraged Mom to keep baby STS on her chest for as much as possible.  Watch for cues.  If baby too sleepy to latch, encouraged her to do breast massage and hand expression and spoon fed baby her EBM.   Mom given lactation brochure, and informed of IP and OP lactation services available to her. To call for assistance as needed.    Consult Status Consult Status: Follow-up Date: 03/16/18 Follow-up type: [place] 03/15/2018, 12:36 PM

## 2018-03-15 NOTE — H&P (Signed)
Jashayla TALETHA TWIFORD is a 42 y.o. female presenting for labor sxs.  Preg complicated by AMA with normal NIPS.  GBS-. OB History    Gravida  2   Para  1   Term  1   Preterm  0   AB  0   Living  1     SAB  0   TAB  0   Ectopic  0   Multiple  0   Live Births  1          Past Medical History:  Diagnosis Date  . Allergy   . Chicken pox   . Kidney stones   . Migraine   . Neuropathy   . Urine incontinence    Past Surgical History:  Procedure Laterality Date  . APPENDECTOMY  2012  . kidney stone removal  2011  . TONSILLECTOMY  1996  . WISDOM TOOTH EXTRACTION     Family History: family history includes ALS in her paternal uncle; Arthritis in her mother; Breast cancer in her mother; Colon cancer in her maternal grandmother; Diabetes in her paternal uncle; Dystonia in her mother; Heart disease in her paternal grandfather and paternal grandmother; Hyperlipidemia in her father and sister; Hypertension in her father; Kidney cancer in her maternal grandfather; Kidney disease in her mother and sister; Multiple sclerosis in her paternal uncle; Other in her father and sister. Social History:  reports that she has never smoked. She has never used smokeless tobacco. She reports that she does not drink alcohol or use drugs.     Maternal Diabetes: No Genetic Screening: Normal Maternal Ultrasounds/Referrals: Normal Fetal Ultrasounds or other Referrals:  None Maternal Substance Abuse:  No Significant Maternal Medications:  None Significant Maternal Lab Results:  None Other Comments:  None  ROS History Dilation: 8 Effacement (%): 100 Station: -1 Exam by:: Raquel Sarna Rothermel RN  Blood pressure (!) 96/54, pulse 85, temperature 99 F (37.2 C), temperature source Oral, resp. rate 18, height 5\' 6"  (1.676 m), weight 168 lb (76.2 kg), SpO2 98 %. Exam Physical Exam  Prenatal labs: ABO, Rh: --/--/O POS, O POS Performed at [facility], [address], [place], [place] [address]  [phone]) Antibody: NEG (07/28 0043) Rubella:   RPR:    HBsAg:    HIV:    GBS:     Assessment/Plan: IUP at term in active labor Admit, epidural Anticipate svd   Kenesha Moshier C 03/15/2018, 3:12 AM

## 2018-03-15 NOTE — Progress Notes (Signed)
Updated Dr Corinna Capra via phone at 0203 about membrane status, vaginal exam at 8cm/100/-1 and that the patient had recently received an epidural at 0117. Came into patient room at approximately 0235 to adjust monitor and change towel/pad underneath patient. Upon pulling back the sheet and moving the patient's legs, baby's head was beginning to crown. Staff assist button pressed, Dr Corinna Capra was called to come in. SVD of viable female infant at 85 by RN.   E. Yatzil Clippinger RN

## 2018-03-16 ENCOUNTER — Other Ambulatory Visit: Payer: Self-pay

## 2018-03-16 MED ORDER — IBUPROFEN 600 MG PO TABS: 600.0000 mg | ORAL_TABLET | Freq: Four times a day (QID) | ORAL | 0 refills | Status: DC

## 2018-03-16 NOTE — Lactation Note (Addendum)
This note was copied from a baby's chart. Lactation Consultation Note Baby 28 hrs. Old hasn't BF since birth. Baby hasn't had interest in BF.  Attempted stimulation to feed. W/gloved finger attempted suck training. Baby biting, very tight. W/gloved finger stroking tongue. Trying to stimulate mouth to open wide for latching. Fitted mom for NS to get baby to latch. #20 NS fits well. #24 NS to train baby to open wider. Firming breast baby latched. Held in mouth for a while, occasionally suckle. Then finally started suckling BF. Noted dimpling. Had "fish lips". Flange adjusted multiple times. Baby kept "fish lips". Mom denied pain. Nipple has normal shape when removed. In #24 NS noted wetness some clear fluid in the base of NS. #20NS applied, mom denied painful latching. Baby feeding well, note tighter latch. Discuss w/mom may need #24 to keep flange wider until baby learns to open wider and have good suck swallow coordination. Encouraged mom to pre-post pump. Hand expression encouraged to give as supplement and stimulation to want to feed. Mom demonstrated hand expression. Thick colostrum noted. FOB taught to hold breast to assist. Mom and FOB excited that baby is finally feeding. Newborn behavior discussed w/parents. Reported to on coming [place].  Patient Name: Kristina Pierce [id number] Date: 03/16/2018 Reason for consult: Follow-up assessment;Mother's request;Difficult latch;Early term 37-38.6wks   Maternal Data    Feeding Feeding Type: Breast Fed Length of feed: 15 min  LATCH Score Latch: Grasps breast easily, tongue down, lips flanged, rhythmical sucking.  Audible Swallowing: A few with stimulation  Type of Nipple: Flat  Comfort (Breast/Nipple): Soft / non-tender  Hold (Positioning): Assistance needed to correctly position infant at breast and maintain latch.  LATCH Score: 7  Interventions Interventions: Hand express  Lactation Tools Discussed/Used Tools: Pump;Nipple  Shields Nipple shield size: 20;24 Breast pump type: Manual Pump Review: Setup, frequency, and cleaning;Milk Storage Initiated by:: RN Date initiated:: 03/15/18   Consult Status Consult Status: Follow-up Date: 03/16/18 Follow-up type: In-patient    Kristina Pierce, Kristina Pierce 03/16/2018, 7:17 AM

## 2018-03-16 NOTE — Progress Notes (Signed)
Post Partum Day 1 Subjective: no complaints, up ad lib, voiding and tolerating PO.  Requests circ today and early d/c.  Objective: Blood pressure 123/72, pulse 76, temperature 98.2 F (36.8 C), resp. rate 18, height 5\' 6"  (1.676 m), weight 168 lb (76.2 kg), SpO2 99 %, unknown if currently breastfeeding.  Physical Exam:  General: alert, cooperative and appears stated age Lochia: appropriate Uterine Fundus: firm Incision: healing well, no significant drainage, no dehiscence DVT Evaluation: No evidence of DVT seen on physical exam. Negative Homan's sign. No cords or calf tenderness.  Recent Labs    03/15/18 0043 03/15/18 0701  HGB 12.4 11.2*  HCT 35.9* 32.1*    Assessment/Plan: Discharge home and Circumcision prior to discharge  Patient is counseled re: risk of bleeding, infection, and scarring.  All questions were answered and the patient wishes to proceed.   LOS: 1 day   Eashan Schipani 03/16/2018, 8:28 AM

## 2018-03-16 NOTE — Discharge Instructions (Signed)
Call MD for T>100.4, heavy vaginal bleeding, severe abdominal pain, or respiratory distress.  Call office to schedule postpartum visit in 6 weeks.  Pelvic rest x 6 weeks.   

## 2018-03-16 NOTE — Discharge Summary (Signed)
Obstetric Discharge Summary Reason for Admission: onset of labor Prenatal Procedures: none Intrapartum Procedures: spontaneous vaginal delivery Postpartum Procedures: none Complications-Operative and Postpartum: 2nd degree perineal laceration Hemoglobin  Date Value Ref Range Status  03/15/2018 11.2 (L) 12.0 - 15.0 g/dL Final   HCT  Date Value Ref Range Status  03/15/2018 32.1 (L) 36.0 - 46.0 % Final    Physical Exam:  General: alert, cooperative and appears stated age 42: appropriate Uterine Fundus: firm Incision: healing well, no significant drainage, no dehiscence DVT Evaluation: No evidence of DVT seen on physical exam. Negative Homan's sign. No cords or calf tenderness.  Discharge Diagnoses: Term Pregnancy-delivered  Discharge Information: Date: 03/16/2018 Activity: pelvic rest Diet: routine Medications: PNV and Ibuprofen Condition: stable Instructions: refer to practice specific booklet Discharge to: home   Newborn Data: Live born female  Birth Weight: 7 lb 6.5 oz (3359 g) APGAR: 108, 9  Newborn Delivery   Birth date/time:  03/15/2018 02:40:00 Delivery type:  Vaginal, Spontaneous     Home with mother.  Kristina Pierce, [place] 03/16/2018, 8:31 AM

## 2018-04-30 ENCOUNTER — Ambulatory Visit: Payer: BC Managed Care – PPO

## 2018-04-30 ENCOUNTER — Ambulatory Visit (INDEPENDENT_AMBULATORY_CARE_PROVIDER_SITE_OTHER): Payer: BC Managed Care – PPO | Admitting: Family Medicine

## 2018-04-30 ENCOUNTER — Encounter: Payer: Self-pay | Admitting: Family Medicine

## 2018-04-30 VITALS — BP 105/72 | HR 74 | Temp 98.2°F | Resp 18 | Ht 66.0 in | Wt 154.2 lb

## 2018-04-30 DIAGNOSIS — Z0000 Encounter for general adult medical examination without abnormal findings: Secondary | ICD-10-CM

## 2018-04-30 DIAGNOSIS — Z131 Encounter for screening for diabetes mellitus: Secondary | ICD-10-CM | POA: Diagnosis not present

## 2018-04-30 DIAGNOSIS — Z130 Encounter for screening for diseases of the blood and blood-forming organs and certain disorders involving the immune mechanism: Secondary | ICD-10-CM

## 2018-04-30 DIAGNOSIS — Z13220 Encounter for screening for lipoid disorders: Secondary | ICD-10-CM

## 2018-04-30 DIAGNOSIS — Z23 Encounter for immunization: Secondary | ICD-10-CM | POA: Diagnosis not present

## 2018-04-30 LAB — COMPREHENSIVE METABOLIC PANEL
ALBUMIN: 4.2 g/dL (ref 3.5–5.2)
ALK PHOS: 52 U/L (ref 39–117)
ALT: 17 U/L (ref 0–35)
AST: 15 U/L (ref 0–37)
BILIRUBIN TOTAL: 0.7 mg/dL (ref 0.2–1.2)
BUN: 17 mg/dL (ref 6–23)
CO2: 29 mEq/L (ref 19–32)
Calcium: 9.1 mg/dL (ref 8.4–10.5)
Chloride: 105 mEq/L (ref 96–112)
Creatinine, Ser: 0.72 mg/dL (ref 0.40–1.20)
GFR: 94.18 mL/min (ref >60.00)
Glucose, Bld: 82 mg/dL (ref 70–99)
POTASSIUM: 4.3 meq/L (ref 3.5–5.1)
SODIUM: 139 meq/L (ref 135–145)
TOTAL PROTEIN: 7 g/dL (ref 6.0–8.3)

## 2018-04-30 LAB — LIPID PANEL
CHOLESTEROL: 186 mg/dL (ref 0–200)
HDL: 61.9 mg/dL (ref >39.00)
LDL Cholesterol: 104 mg/dL — ABNORMAL HIGH (ref 0–99)
NonHDL: 123.68
Total CHOL/HDL Ratio: 3
Triglycerides: 97 mg/dL (ref 0.0–149.0)
VLDL: 19.4 mg/dL (ref 0.0–40.0)

## 2018-04-30 LAB — CBC WITH DIFFERENTIAL/PLATELET
BASOS ABS: 0 10*3/uL (ref 0.0–0.1)
Basophils Relative: 0.7 % (ref 0.0–3.0)
Eosinophils Absolute: 0.1 10*3/uL (ref 0.0–0.7)
Eosinophils Relative: 2.2 % (ref 0.0–5.0)
HCT: 40.6 % (ref 36.0–46.0)
HEMOGLOBIN: 13.6 g/dL (ref 12.0–15.0)
Lymphocytes Relative: 25.3 % (ref 12.0–46.0)
Lymphs Abs: 1.5 10*3/uL (ref 0.7–4.0)
MCHC: 33.5 g/dL (ref 30.0–36.0)
MCV: 95.6 fl (ref 78.0–100.0)
MONO ABS: 0.3 10*3/uL (ref 0.1–1.0)
MONOS PCT: 5.2 % (ref 3.0–12.0)
Neutro Abs: 4 10*3/uL (ref 1.4–7.7)
Neutrophils Relative %: 66.6 % (ref 43.0–77.0)
Platelets: 238 10*3/uL (ref 150.0–400.0)
RBC: 4.25 Mil/uL (ref 3.87–5.11)
RDW: 12.8 % (ref 11.5–15.5)
WBC: 6.1 10*3/uL (ref 4.0–10.5)

## 2018-04-30 LAB — HEMOGLOBIN A1C: Hgb A1c MFr Bld: 5.5 % (ref 4.6–6.5)

## 2018-04-30 NOTE — Progress Notes (Addendum)
Patient ID: Kristina Pierce, female  DOB: 25-Jun-1976, 42 y.o.   MRN: [id number] Patient Care Team    Relationship Specialty Notifications Start End  [name], DO PCP - General Family Medicine  08/14/16   [name], MD Consulting Physician Urology  08/14/16   [name], MD Consulting Physician Obstetrics and Gynecology  04/30/18     Chief Complaint  Patient presents with  . Annual Exam    Subjective:  Kristina Pierce is a 42 y.o.  Female  present for CPE. All past medical history, surgical history, allergies, family history, immunizations, medications and social history were updated in the electronic medical record today. All recent labs, ED visits and hospitalizations within the last year were reviewed.  Health maintenance:  Colonoscopy: Fhx colon cancer MGM (79). Routine screen at 50.  Mammogram: FHX breast cancer in mother (28).  completed:02/2017, birads 2, benign. Dense breast.  Cervical cancer screening: last pap: 08/2017, completed by: Dr. Matthew Saras Immunizations: tdap 2018 UTD, Influenza completed today  Infectious disease screening: HIV completed 2015 DEXA: N/A Assistive device: none Oxygen AUQ:JFHL Patient has a Dental home. Hospitalizations/ED visits: none  Depression screen [facility] 2/9 04/30/2018 08/14/2016  Decreased Interest 0 0  Down, Depressed, Hopeless 0 0  PHQ - 2 Score 0 0   No flowsheet data found.   Current Exercise Habits: The patient does not participate in regular exercise at present Exercise limited by: None identified   Immunization History  Administered Date(s) Administered  . Influenza,inj,Quad PF,6+ Mos 04/30/2018     Past Medical History:  Diagnosis Date  . Allergy   . Chicken pox   . Kidney stones   . Migraine   . Neuropathy   . Urine incontinence    Allergies  Allergen Reactions  . Goat Milk Hives  . Goat-Derived Metallurgist  . Chlorhexidine Itching and Rash   Past Surgical History:  Procedure  Laterality Date  . APPENDECTOMY  2012  . kidney stone removal  2011  . TONSILLECTOMY  1996  . WISDOM TOOTH EXTRACTION     Family History  Problem Relation Age of Onset  . Dystonia Mother        cervical dystonia from trauma  . Kidney disease Mother        stones  . Breast cancer Mother   . Arthritis Mother   . Hypertension Father   . Hyperlipidemia Father   . Other Father        amyloidosis  . Hyperlipidemia Sister   . Kidney disease Sister        stones  . Other Sister        hearing loss, etio?  . ALS Paternal Uncle   . Multiple sclerosis Paternal Uncle   . Diabetes Paternal Uncle   . Kidney cancer Maternal Grandfather   . Heart disease Paternal Grandmother   . Heart disease Paternal Grandfather   . Colon cancer Maternal Grandmother    Social History   Socioeconomic History  . Marital status: Married    Spouse name: mark  . Number of children: 1  . Years of education: Restaurant manager, fast food  . Highest education level: Not on file  Occupational History  . Occupation: Product manager: [employer]    Comment: HS chemistry  Social Needs  . Financial resource strain: Not on file  . Food insecurity:    Worry: Not on file    Inability: Not on file  . Transportation needs:  Medical: Not on file    Non-medical: Not on file  Tobacco Use  . Smoking status: Never Smoker  . Smokeless tobacco: Never Used  Substance and Sexual Activity  . Alcohol use: No  . Drug use: No  . Sexual activity: Not Currently    Partners: Male    Comment: married  Lifestyle  . Physical activity:    Days per week: Not on file    Minutes per session: Not on file  . Stress: Not on file  Relationships  . Social connections:    Talks on phone: Not on file    Gets together: Not on file    Attends religious service: Not on file    Active member of club or organization: Not on file    Attends meetings of clubs or organizations: Not on file    Relationship status: Not on file  .  Intimate partner violence:    Fear of current or ex partner: Not on file    Emotionally abused: Not on file    Physically abused: Not on file    Forced sexual activity: Not on file  Other Topics Concern  . Not on file  Social History Narrative   [name] lives with her husband [name]) & daughter [name]). She works Medical laboratory scientific officer at [employer], Research scientist (medical).   Masters degree.   Drinks caffeine, takes a daily vitamin.   Wear seatbelt, wears a bicycle helmet, smoke detector at home.   Feels safe in her relationships.   Allergies as of 04/30/2018      Reactions   Goat Milk Hives   Goat-derived Products Hives   Chlorhexidine Itching, Rash      Medication List        Accurate as of 04/30/18  9:48 AM. Always use your most recent med list.          multivitamin tablet Take 1 tablet by mouth daily.       All past medical history, surgical history, allergies, family history, immunizations andmedications were updated in the EMR today and reviewed under the history and medication portions of their EMR.      No results found.   ROS: 14 pt review of systems performed and negative (unless mentioned in an HPI)  Objective: BP 105/72 (BP Location: Right Arm, Patient Position: Sitting, Cuff Size: Normal)   Pulse 74   Temp 98.2 F (36.8 C)   Resp 18   Ht 5' 6" (1.676 m)   Wt 154 lb 4 oz (70 kg)   SpO2 97%   Breastfeeding? No   BMI 24.90 kg/m  Gen: Afebrile. No acute distress. Nontoxic in appearance, well-developed, well-nourished,  Pleasant caucasian female.  HENT: AT. [place]. Bilateral TM visualized and normal in appearance, normal external auditory canal. MMM, no oral lesions, adequate dentition. Bilateral nares within normal limits. Throat without erythema, ulcerations or exudates. no Cough on exam, no hoarseness on exam. Eyes:Pupils Equal Round Reactive to light, Extraocular movements intact,  Conjunctiva without redness, discharge or icterus. Neck/lymp/endocrine: Supple,no  lymphadenopathy, no thyromegaly CV: RRR no murmur, no edema, +2/4 P posterior tibialis pulses. no carotid bruits. No JVD. Chest: CTAB, no wheeze, rhonchi or crackles. normal Respiratory effort. good Air movement. Abd: Soft. flat. NTND. BS present. no Masses palpated. No hepatosplenomegaly. No rebound tenderness or guarding. Skin: no rashes, purpura or petechiae. Warm and well-perfused. Skin intact. Neuro/Msk:  Normal gait. PERLA. EOMi. Alert. Oriented x3.  Cranial nerves II through XII intact. Muscle strength 5/5 upper/lower extremity.  DTRs equal bilaterally. Psych: Normal affect, dress and demeanor. Normal speech. Normal thought content and judgment.   No exam data present  Assessment/plan: Kristina Pierce is a 42 y.o. female present for CPE. Lipid screening - Lipid panel Screening for diabetes mellitus - HgB A1c Screening for iron deficiency anemia - CBC w/Diff Need for vaccination Flu shot administered today. Preventative health care - Comp Met (CMET) Patient was encouraged to exercise greater than 150 minutes a week. Patient was encouraged to choose a diet filled with fresh fruits and vegetables, and lean meats. AVS provided to patient today for education/recommendation on gender specific health and safety maintenance. Colonoscopy: Fhx colon cancer MGM (23). Routine screen at 50.  Mammogram: FHX breast cancer in mother (29).  completed:02/2017, birads 2, benign. Dense breast.  Cervical cancer screening: last pap: 08/2017, completed by: Dr. Matthew Saras Immunizations: tdap 2018 UTD, Influenza completed today  Infectious disease screening: HIV completed 2015 DEXA: N/A  Return in about 1 year (around 05/01/2019) for CPE.  Electronically signed by: Howard Pouch, DO [place]

## 2018-04-30 NOTE — Patient Instructions (Signed)

## 2018-08-14 ENCOUNTER — Encounter: Payer: Self-pay | Admitting: Family Medicine

## 2018-08-14 ENCOUNTER — Ambulatory Visit: Payer: BC Managed Care – PPO | Admitting: Family Medicine

## 2018-08-14 VITALS — BP 120/75 | HR 80 | Temp 98.8°F | Resp 16 | Ht 66.0 in | Wt 163.0 lb

## 2018-08-14 DIAGNOSIS — J00 Acute nasopharyngitis [common cold]: Secondary | ICD-10-CM

## 2018-08-14 DIAGNOSIS — H6983 Other specified disorders of Eustachian tube, bilateral: Secondary | ICD-10-CM | POA: Diagnosis not present

## 2018-08-14 MED ORDER — FLUTICASONE PROPIONATE 50 MCG/ACT NA SUSP: 2.0000 | Freq: Every day | NASAL | 6 refills | Status: DC

## 2018-08-14 NOTE — Patient Instructions (Signed)
Start flonase nasal spray 2 sprays daily. Prescribed.  Use flonase for a about 4 days, if not seeing improvement also add zyrtec nightly before bed.  This looks more allergy related and not infectious.    Eustachian Tube Dysfunction  Eustachian tube dysfunction refers to a condition in which a blockage develops in the narrow passage that connects the middle ear to the back of the nose (eustachian tube). The eustachian tube regulates air pressure in the middle ear by letting air move between the ear and nose. It also helps to drain fluid from the middle ear space. Eustachian tube dysfunction can affect one or both ears. When the eustachian tube does not function properly, air pressure, fluid, or both can build up in the middle ear. What are the causes? This condition occurs when the eustachian tube becomes blocked or cannot open normally. Common causes of this condition include:  Ear infections.  Colds and other infections that affect the nose, mouth, and throat (upper respiratory tract).  Allergies.  Irritation from cigarette smoke.  Irritation from stomach acid coming up into the esophagus (gastroesophageal reflux). The esophagus is the tube that carries food from the mouth to the stomach.  Sudden changes in air pressure, such as from descending in an airplane or scuba diving.  Abnormal growths in the nose or throat, such as: ? Growths that line the nose (nasal polyps). ? Abnormal growth of cells (tumors). ? Enlarged tissue at the back of the throat (adenoids). What increases the risk? You are more likely to develop this condition if:  You smoke.  You are overweight.  You are a child who has: ? Certain birth defects of the mouth, such as cleft palate. ? Large tonsils or adenoids. What are the signs or symptoms? Common symptoms of this condition include:  A feeling of fullness in the ear.  Ear pain.  Clicking or popping noises in the ear.  Ringing in the ear.  Hearing  loss.  Loss of balance.  Dizziness. Symptoms may get worse when the air pressure around you changes, such as when you travel to an area of high elevation, fly on an airplane, or go scuba diving. How is this diagnosed? This condition may be diagnosed based on:  Your symptoms.  A physical exam of your ears, nose, and throat.  Tests, such as those that measure: ? The movement of your eardrum (tympanogram). ? Your hearing (audiometry). How is this treated? Treatment depends on the cause and severity of your condition.  In mild cases, you may relieve your symptoms by moving air into your ears. This is called "popping the ears."  In more severe cases, or if you have symptoms of fluid in your ears, treatment may include: ? Medicines to relieve congestion (decongestants). ? Medicines that treat allergies (antihistamines). ? Nasal sprays or ear drops that contain medicines that reduce swelling (steroids). ? A procedure to drain the fluid in your eardrum (myringotomy). In this procedure, a small tube is placed in the eardrum to:  Drain the fluid.  Restore the air in the middle ear space. ? A procedure to insert a balloon device through the nose to inflate the opening of the eustachian tube (balloon dilation). Follow these instructions at home: Lifestyle  Do not do any of the following until your health care provider approves: ? Travel to high altitudes. ? Fly in airplanes. ? Work in a Pension scheme manager or room. ? Scuba dive.  Do not use any products that contain nicotine or  tobacco, such as cigarettes and e-cigarettes. If you need help quitting, ask your health care provider.  Keep your ears dry. Wear fitted earplugs during showering and bathing. Dry your ears completely after. General instructions  Take over-the-counter and prescription medicines only as told by your health care provider.  Use techniques to help pop your ears as recommended by your health care provider. These  may include: ? Chewing gum. ? Yawning. ? Frequent, forceful swallowing. ? Closing your mouth, holding your nose closed, and gently blowing as if you are trying to blow air out of your nose.  Keep all follow-up visits as told by your health care provider. This is important. Contact a health care provider if:  Your symptoms do not go away after treatment.  Your symptoms come back after treatment.  You are unable to pop your ears.  You have: ? A fever. ? Pain in your ear. ? Pain in your head or neck. ? Fluid draining from your ear.  Your hearing suddenly changes.  You become very dizzy.  You lose your balance. Summary  Eustachian tube dysfunction refers to a condition in which a blockage develops in the eustachian tube.  It can be caused by ear infections, allergies, inhaled irritants, or abnormal growths in the nose or throat.  Symptoms include ear pain, hearing loss, or ringing in the ears.  Mild cases are treated with maneuvers to unblock the ears, such as yawning or ear popping.  Severe cases are treated with medicines. Surgery may also be done (rare). This information is not intended to replace advice given to you by your health care provider. Make sure you discuss any questions you have with your health care provider. Document Released: 09/01/2015 Document Revised: 11/25/2017 Document Reviewed: 11/25/2017 Elsevier Interactive Patient Education  2019 Else[employer] Inc.  Allergic Rhinitis, Adult Allergic rhinitis is a reaction to allergens in the air. Allergens are tiny specks (particles) in the air that cause your body to have an allergic reaction. This condition cannot be passed from person to person (is not contagious). Allergic rhinitis cannot be cured, but it can be controlled. There are two types of allergic rhinitis:  Seasonal. This type is also called hay fever. It happens only during certain times of the year.  Perennial. This type can happen at any time of the  year. What are the causes? This condition may be caused by:  Pollen from grasses, trees, and weeds.  House dust mites.  Pet dander.  Mold. What are the signs or symptoms? Symptoms of this condition include:  Sneezing.  Runny or stuffy nose (nasal congestion).  A lot of mucus in the back of the throat (postnasal drip).  Itchy nose.  Tearing of the eyes.  Trouble sleeping.  Being sleepy during day. How is this treated? There is no cure for this condition. You should avoid things that trigger your symptoms (allergens). Treatment can help to relieve symptoms. This may include:  Medicines that block allergy symptoms, such as antihistamines. These may be given as a shot, nasal spray, or pill.  Shots that are given until your body becomes less sensitive to the allergen (desensitization).  Stronger medicines, if all other treatments have not worked. Follow these instructions at home: Avoiding allergens   Find out what you are allergic to. Common allergens include smoke, dust, and pollen.  Avoid them if you can. These are some of the things that you can do to avoid allergens: ? Replace carpet with wood, tile, or vinyl flooring.  Carpet can trap dander and dust. ? Clean any mold found in the home. ? Do not smoke. Do not allow smoking in your home. ? Change your heating and air conditioning filter at least once a month. ? During allergy season:  Keep windows closed as much as you can. If possible, use air conditioning when there is a lot of pollen in the air.  Use a special filter for allergies with your furnace and air conditioner.  Plan outdoor activities when pollen counts are lowest. This is usually during the early morning or evening hours.  If you do go outdoors when pollen count is high, wear a special mask for people with allergies.  When you come indoors, take a shower and change your clothes before sitting on furniture or bedding. General instructions  Do not  use fans in your home.  Do not hang clothes outside to dry.  Wear sunglasses to keep pollen out of your eyes.  Wash your hands right away after you touch household pets.  Take over-the-counter and prescription medicines only as told by your doctor.  Keep all follow-up visits as told by your doctor. This is important. Contact a doctor if:  You have a fever.  You have a cough that does not go away (is persistent).  You start to make whistling sounds when you breathe (wheeze).  Your symptoms do not get better with treatment.  You have thick fluid coming from your nose.  You start to have nosebleeds. Get help right away if:  Your tongue or your lips are swollen.  You have trouble breathing.  You feel dizzy or you feel like you are going to pass out (faint).  You have cold sweats. Summary  Allergic rhinitis is a reaction to allergens in the air.  This condition may be caused by allergens. These include pollen, dust mites, pet dander, and mold.  Symptoms include a runny, itchy nose, sneezing, or tearing eyes. You may also have trouble sleeping or feel sleepy during the day.  Treatment includes taking medicines and avoiding allergens. You may also get shots or take stronger medicines.  Get help if you have a fever or a cough that does not stop. Get help right away if you are short of breath. This information is not intended to replace advice given to you by your health care provider. Make sure you discuss any questions you have with your health care provider. Document Released: 12/05/2010 Document Revised: 02/24/2018 Document Reviewed: 02/24/2018 Elsevier Interactive Patient Education  2019 [employer].

## 2018-08-14 NOTE — Progress Notes (Signed)
Kristina Pierce , 24-Jan-1976, 42 y.o., female MRN: [id number] Patient Care Team    Relationship Specialty Notifications Start End  [name], DO PCP - General Family Medicine  08/14/16   [name], MD Consulting Physician Urology  08/14/16   [name], MD Consulting Physician Obstetrics and Gynecology  04/30/18     Chief Complaint  Patient presents with  . Ear Pain    itching and aching at night x 5 days     Subjective: Pt presents for an OV with complaints of itchy and aching bilateral ears of 5 days duration.  Associated symptoms include she had a cold for a few weeks, but that is resolving. No hearing changes or ringing in the ears.  Pt has tried mucinex and sudafed x1 to ease their symptoms. She denies fever, chills, nausea or vomit.   Depression screen [facility] 2/9 04/30/2018 08/14/2016  Decreased Interest 0 0  Down, Depressed, Hopeless 0 0  PHQ - 2 Score 0 0    Allergies  Allergen Reactions  . Goat Milk Hives  . Goat-Derived Metallurgist  . Chlorhexidine Itching and Rash   Social History   Tobacco Use  . Smoking status: Never Smoker  . Smokeless tobacco: Never Used  Substance Use Topics  . Alcohol use: No   Past Medical History:  Diagnosis Date  . Allergy   . Chicken pox   . Kidney stones   . Migraine   . Neuropathy   . Urine incontinence    Past Surgical History:  Procedure Laterality Date  . APPENDECTOMY  2012  . kidney stone removal  2011  . TONSILLECTOMY  1996  . WISDOM TOOTH EXTRACTION     Family History  Problem Relation Age of Onset  . Dystonia Mother        cervical dystonia from trauma  . Kidney disease Mother        stones  . Breast cancer Mother   . Arthritis Mother   . Hypertension Father   . Hyperlipidemia Father   . Other Father        amyloidosis  . Hyperlipidemia Sister   . Kidney disease Sister        stones  . Other Sister        hearing loss, etio?  . ALS Paternal Uncle   . Multiple sclerosis  Paternal Uncle   . Diabetes Paternal Uncle   . Kidney cancer Maternal Grandfather   . Heart disease Paternal Grandmother   . Heart disease Paternal Grandfather   . Colon cancer Maternal Grandmother    Allergies as of 08/14/2018      Reactions   Goat Milk Hives   Goat-derived Products Hives   Chlorhexidine Itching, Rash      Medication List       Accurate as of August 14, 2018  1:05 PM. Always use your most recent med list.        multivitamin tablet Take 1 tablet by mouth daily.       All past medical history, surgical history, allergies, family history, immunizations andmedications were updated in the EMR today and reviewed under the history and medication portions of their EMR.     ROS: Negative, with the exception of above mentioned in HPI   Objective:  BP 120/75 (BP Location: Right Arm, Patient Position: Sitting, Cuff Size: Normal)   Pulse 80   Temp 98.8 F (37.1 C) (Oral)   Resp 16   Ht 5\' 6"  (  1.676 m)   Wt 163 lb (73.9 kg)   SpO2 97%   BMI 26.31 kg/m  Body mass index is 26.31 kg/m. Gen: Afebrile. No acute distress. Nontoxic in appearance, well developed, well nourished.  HENT: AT. [place]. Bilateral TM visualized with mild fullness, no erythema, EAM normal. MMM, no oral lesions. Bilateral nares with mild erythema and swelling. Throat without erythema or exudates. No cough, no hoarseness, no TTP sinus. Eyes:Pupils Equal Round Reactive to light, Extraocular movements intact,  Conjunctiva without redness, discharge or icterus. Neck/lymp/endocrine: Supple,no lymphadenopathy CV: RRR  Chest: CTAB, no wheeze or crackles. Good air movement, normal resp effort.  Skin: no rashes, purpura or petechiae.  Neuro:  Normal gait. PERLA. EOMi. Alert. Oriented x3   No exam data present No results found. No results found for this or any previous visit (from the past 24 hour(s)).  Assessment/Plan: ANNIYAH MOOD is a 42 y.o. female present for OV for  Acute  rhinitis/Eustachian tube dysfunction Start flonase nasal spray 2 sprays daily. Prescribed.  Use flonase for a about 4 days, if not seeing improvement also add zyrtec nightly before bed.  This looks more allergy related and not infectious.  F/u PRN   Reviewed expectations re: course of current medical issues.  Discussed self-management of symptoms.  Outlined signs and symptoms indicating need for more acute intervention.  Patient verbalized understanding and all questions were answered.  Patient received an After-Visit Summary.    No orders of the defined types were placed in this encounter.    Note is dictated utilizing voice recognition software. Although note has been proof read prior to signing, occasional typographical errors still can be missed. If any questions arise, please do not hesitate to call for verification.   electronically signed by:  Howard Pouch, DO  [place]

## 2018-10-27 ENCOUNTER — Ambulatory Visit: Payer: Self-pay

## 2018-10-27 NOTE — Telephone Encounter (Signed)
Called and spoke with patient to let her know that we do not RX tamiflu for an exposure and she could schedule appt if she started becoming symptomatic. Pt verbalized understanding

## 2018-10-27 NOTE — Telephone Encounter (Signed)
Pt's children tested positive for the flu yesterday. Pt is not having any symptoms. Pt is a Education officer, museum. Pt called asking for Tamiflu. No heart or lung problems or a weakened immune system. If called in, call into [facility]. Care advice given and pt verbalized understanding. Routing note to provider.   Reason for Disposition . [1] Influenza EXPOSURE within last 48 hours (2 days) AND [2] NOT HIGH RISK AND [3] strongly requests antiviral medication    School teacher  Answer Assessment - Initial Assessment Questions 1. TYPE of EXPOSURE: "How were you exposed?" (e.g., close contact, not a close contact)    43 yr old and 5 month old both tested positive Influenza B yesterday 2. DATE of EXPOSURE: "When did the exposure occur?" (e.g., hour, days, weeks)     Friday 3. PREGNANCY: "Is there any chance you are pregnant?" "When was your last menstrual period?"     No LMP: no periods yet 7 months post partum 4. HIGH RISK for COMPLICATIONS: "Do you have any heart or lung problems? Do you have a weakened immune system?" (e.g., CHF, COPD, asthma, HIV positive, chemotherapy, renal failure, diabetes mellitus, sickle cell anemia)     No-no-no 5. SYMPTOMS: "Do you have any symptoms?" (e.g., cough, fever, sore throat, difficulty breathing).     no  Protocols used: INFLUENZA EXPOSURE-A-AH

## 2019-08-10 ENCOUNTER — Other Ambulatory Visit: Payer: Self-pay | Admitting: Obstetrics and Gynecology

## 2019-08-10 DIAGNOSIS — Z803 Family history of malignant neoplasm of breast: Secondary | ICD-10-CM

## 2019-10-27 ENCOUNTER — Encounter: Payer: Self-pay | Admitting: Gastroenterology

## 2019-12-02 ENCOUNTER — Encounter: Payer: Self-pay | Admitting: Gastroenterology

## 2019-12-02 ENCOUNTER — Ambulatory Visit: Payer: BC Managed Care – PPO | Admitting: Gastroenterology

## 2019-12-02 VITALS — BP 112/76 | HR 84 | Temp 98.5°F | Ht 66.0 in | Wt 177.0 lb

## 2019-12-02 DIAGNOSIS — Z8371 Family history of colonic polyps: Secondary | ICD-10-CM | POA: Diagnosis not present

## 2019-12-02 DIAGNOSIS — Z800 Family history of malignant neoplasm of digestive organs: Secondary | ICD-10-CM

## 2019-12-02 MED ORDER — FAMOTIDINE 20 MG PO TABS: 20.0000 mg | ORAL_TABLET | Freq: Two times a day (BID) | ORAL | 2 refills | Status: DC

## 2019-12-02 NOTE — Patient Instructions (Addendum)
I have recommended a trial of famotidine 20 mg used every evening to try to minimize your heartburn.   I have recommended a colonoscopy given your family history and genetic changes.  Tips for colonoscopy:  - Stay well hydrated for 3-4 days prior to the exam. This reduces nausea and dehydration.  - To prevent skin/hemorrhoid irritation - prior to wiping, put A&Dointment or vaseline on the toilet paper. - Keep a towel or pad on the bed.  - Drink  64oz of clear liquids in the morning of prep day (prior to starting the prep) to be sure that there is enough fluid to flush the colon and stay hydrated!!!! This is in addition to the fluids required for preparation. - Use of a flavored hard candy, such as grape Anise Salvo, can counteract some of the flavor of the prep and may prevent some nausea.   I value your feedback and thank you for entrusting [place] with your care. If you get a [facility] patient survey, I would appreciate you taking the time to let us know about your experience today. Thank you!   Due to recent changes in healthcare laws, you may see the results of your imaging and laboratory studies on MyChart before your provider has had a chance to review them.  We understand that in some cases there may be results that are confusing or concerning to you. Not all laboratory results come back in the same time frame and the provider may be waiting for multiple results in order to interpret others.  Please give [place] 48 hours in order for your provider to thoroughly review all the results before contacting the office for clarification of your results.

## 2019-12-02 NOTE — Progress Notes (Signed)
Referring Provider: [name], DO Primary Care Physician:  [name], DO  Reason for Consultation:  Genetic mutation   IMPRESSION:  Heterozygous RNF43 Gene mutation Family history of colon polyps (mother <60) Family history of colon cancer (grandmother at age 44) Recent onset of nighttime GERD  Genetic mutation with increased risk for sessile polyposis syndrome: Colonoscopy recommended.  Family history of colon polyps at age <60: Colonoscopy recommended.  Two week history of nighttime GERD: No alarm features. Reviewed lifestyle modifications. Recommend trial of famotidine 20 mg at dinner. She will let me know if symptoms persist or worsen.     PLAN: Famotidine 20 mg QD-BID PRN Reviewed GERD lifestyle modifications Colonoscopy for colon cancer screnning  I consented the patient discussing the risks, benefits, and alternatives to endoscopic evaluation. In particular, we discussed the risks that include, but are not limited to, reaction to medication, cardiopulmonary compromise, bleeding requiring blood transfusion, aspiration resulting in pneumonia, perforation requiring surgery, lack of diagnosis, severe illness requiring hospitalization, and even death. We reviewed the risk of missed lesion including polyps or even cancer. The patient acknowledges these risks and asks that we proceed.  Please see the "Patient Instructions" section for addition details about the plan.  HPI: Kristina Pierce is a 44 y.o. female referred by Dr. Matthew Saras. The history is obtained through the patient, records that she provides, and review of her electronic medical record.  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   Genetic testing performed for family history of breast cancer revealed that she is heterozygous for RNF43. Maternal grandmother with colon cancer at age 68. Mother with precancerous colon polyps. No other known family history of colon cancer or polyps. No family  history of uterine/endometrial cancer, pancreatic cancer or gastric/stomach cancer.  GI ROS is negative except for nighttime reflux over the last couple of weeks. Eats dinner at 5:30. Does paperwork at work from 8-10 in relationship to her chemistry teaching at [employer] during the daytime. No nocturnal symptoms. No dysphagia or odynophagia. Has not tried any prescription, OTC treatments, or dietary changes.  She wonders if there is a component of stress.     Past Medical History:  Diagnosis Date  . Allergy   . Chicken pox   . Kidney stones   . Migraine   . Neuropathy   . Urine incontinence     Past Surgical History:  Procedure Laterality Date  . APPENDECTOMY  2012  . kidney stone removal  2011  . TONSILLECTOMY  1996  . WISDOM TOOTH EXTRACTION      Current Outpatient Medications  Medication Sig Dispense Refill  . fluticasone (FLONASE) 50 MCG/ACT nasal spray Place 2 sprays into both nostrils daily. 16 g 6  . JUNEL FE 1/20 1-20 MG-MCG tablet Take 1 tablet by mouth daily.    . Multiple Vitamin (MULTIVITAMIN) tablet Take 1 tablet by mouth daily.     No current facility-administered medications for this visit.    Allergies as of 12/02/2019 - Review Complete 12/02/2019  Allergen Reaction Noted  . Goat milk Hives 03/16/2012  . Goat-derived products Hives 03/16/2012  . Chlorhexidine Itching and Rash 12/11/2011    Family History  Problem Relation Age of Onset  . Dystonia Mother        cervical dystonia from trauma  . Kidney disease Mother        stones  . Breast cancer Mother   . Arthritis Mother   . Hypertension Father   . Hyperlipidemia  Father   . Other Father        amyloidosis  . Hyperlipidemia Sister   . Kidney disease Sister        stones  . Other Sister        hearing loss, etio?  . ALS Paternal Uncle   . Multiple sclerosis Paternal Uncle   . Diabetes Paternal Uncle   . Kidney cancer Maternal Grandfather   . Heart disease Paternal Grandmother   .  Heart disease Paternal Grandfather   . Colon cancer Maternal Grandmother     Social History   Socioeconomic History  . Marital status: Married    Spouse name: mark  . Number of children: 2  . Years of education: Restaurant manager, fast food  . Highest education level: Not on file  Occupational History  . Occupation: Product manager: [employer]    Comment: HS chemistry  Tobacco Use  . Smoking status: Never Smoker  . Smokeless tobacco: Never Used  Substance and Sexual Activity  . Alcohol use: No  . Drug use: No  . Sexual activity: Yes    Partners: Male    Birth control/protection: Pill    Comment: married  Other Topics Concern  . Not on file  Social History Narrative   Ms. Majkowski lives with her husband [name]) & daughter [name]). She works Medical laboratory scientific officer at [employer], Research scientist (medical).   Masters degree.   Drinks caffeine, takes a daily vitamin.   Wear seatbelt, wears a bicycle helmet, smoke detector at home.   Feels safe in her relationships.   Social Determinants of Health   Financial Resource Strain:   . Difficulty of Paying Living Expenses:   Food Insecurity:   . Worried About Charity fundraiser in the Last Year:   . Arboriculturist in the Last Year:   Transportation Needs:   . Film/video editor (Medical):   Marland Kitchen Lack of Transportation (Non-Medical):   Physical Activity:   . Days of Exercise per Week:   . Minutes of Exercise per Session:   Stress:   . Feeling of Stress :   Social Connections:   . Frequency of Communication with Friends and Family:   . Frequency of Social Gatherings with Friends and Family:   . Attends Religious Services:   . Active Member of Clubs or Organizations:   . Attends Archivist Meetings:   Marland Kitchen Marital Status:   Intimate Partner Violence:   . Fear of Current or Ex-Partner:   . Emotionally Abused:   Marland Kitchen Physically Abused:   . Sexually Abused:     Review of Systems: 12 system ROS is negative except as noted above.   Physical  Exam: General:   Alert,  well-nourished, pleasant and cooperative in NAD Head:  Normocephalic and atraumatic. Eyes:  Sclera clear, no icterus.   Conjunctiva pink. Ears:  Normal auditory acuity. Nose:  No deformity, discharge,  or lesions. Mouth:  No deformity or lesions.   Neck:  Supple; no masses or thyromegaly. Lungs:  Clear throughout to auscultation.   No wheezes. Heart:  Regular rate and rhythm; no murmurs. Abdomen:  Soft, nontender, nondistended, normal bowel sounds, no rebound or guarding. No hepatosplenomegaly.   Rectal:  Deferred  Msk:  Symmetrical. No boney deformities LAD: No inguinal or umbilical LAD Extremities:  No clubbing or edema. Neurologic:  Alert and  oriented x4;  grossly nonfocal Skin:  Intact without significant lesions or rashes. Psych:  Alert and cooperative. Normal  mood and affect.     Eriel Dunckel L. Tarri Glenn, MD, MPH 12/02/2019, 8:57 AM

## 2020-01-26 ENCOUNTER — Other Ambulatory Visit: Payer: Self-pay

## 2020-01-26 ENCOUNTER — Telehealth: Payer: Self-pay | Admitting: Gastroenterology

## 2020-01-26 DIAGNOSIS — Z800 Family history of malignant neoplasm of digestive organs: Secondary | ICD-10-CM

## 2020-01-26 DIAGNOSIS — Z8371 Family history of colonic polyps: Secondary | ICD-10-CM

## 2020-01-26 MED ORDER — NA SULFATE-K SULFATE-MG SULF 17.5-3.13-1.6 GM/177ML PO SOLN: ORAL | 0 refills | Status: DC

## 2020-01-26 NOTE — Telephone Encounter (Signed)
Pt states that her pharmacy has not received prescription for suprep. Pls send it again. Pt's procedure is on 6/21. She uses [facility] on [address].

## 2020-01-26 NOTE — Telephone Encounter (Signed)
Spoke with patient, patient aware that Suprep has been sent in to patient's preferred pharmacy, pt advised to take prep as directed in pre visit instructions. Pt advised to call with any questions or concerns.

## 2020-01-28 ENCOUNTER — Encounter: Payer: Self-pay | Admitting: Gastroenterology

## 2020-01-28 ENCOUNTER — Telehealth: Payer: Self-pay

## 2020-01-28 NOTE — Telephone Encounter (Signed)
Pt stated that Suprep is $60 and inquired whether we can send a coupon to pharmacy.

## 2020-01-28 NOTE — Telephone Encounter (Signed)
Called in Coupon to [facility] as per pt's request - left this information on the pharmacy Voice mail  [place] coupon:  [facility] [id number] PCN- CN  GROUP  [id number] ID  [id number]  MARIE PV

## 2020-01-28 NOTE — Telephone Encounter (Signed)
I attempted to call pt to inform her of this and her VM is  full

## 2020-01-28 NOTE — Telephone Encounter (Signed)
See note from 01/26/20 about cost of Suprep and call

## 2020-01-28 NOTE — Telephone Encounter (Signed)
See TE from 01-26-2020

## 2020-02-07 ENCOUNTER — Other Ambulatory Visit: Payer: Self-pay

## 2020-02-07 ENCOUNTER — Encounter: Payer: Self-pay | Admitting: Gastroenterology

## 2020-02-07 ENCOUNTER — Ambulatory Visit (AMBULATORY_SURGERY_CENTER): Payer: BC Managed Care – PPO | Admitting: Gastroenterology

## 2020-02-07 VITALS — BP 128/85 | HR 62 | Temp 97.5°F | Resp 18 | Ht 66.0 in | Wt 177.0 lb

## 2020-02-07 DIAGNOSIS — D124 Benign neoplasm of descending colon: Secondary | ICD-10-CM

## 2020-02-07 DIAGNOSIS — Z8371 Family history of colonic polyps: Secondary | ICD-10-CM | POA: Diagnosis not present

## 2020-02-07 DIAGNOSIS — D123 Benign neoplasm of transverse colon: Secondary | ICD-10-CM

## 2020-02-07 DIAGNOSIS — Z1211 Encounter for screening for malignant neoplasm of colon: Secondary | ICD-10-CM | POA: Diagnosis not present

## 2020-02-07 DIAGNOSIS — Z800 Family history of malignant neoplasm of digestive organs: Secondary | ICD-10-CM

## 2020-02-07 DIAGNOSIS — D125 Benign neoplasm of sigmoid colon: Secondary | ICD-10-CM

## 2020-02-07 DIAGNOSIS — K635 Polyp of colon: Secondary | ICD-10-CM | POA: Diagnosis not present

## 2020-02-07 MED ORDER — SODIUM CHLORIDE 0.9 % IV SOLN: 500.0000 mL | INTRAVENOUS | Status: DC

## 2020-02-07 NOTE — Progress Notes (Signed)
pt tolerated well. VSS. awake and to recovery. Report given to RN.  

## 2020-02-07 NOTE — Progress Notes (Signed)
Called to room to assist during endoscopic procedure.  Patient ID and intended procedure confirmed with present staff. Received instructions for my participation in the procedure from the performing physician.  

## 2020-02-07 NOTE — Patient Instructions (Signed)
Handout provided on polyps.   YOU HAD AN ENDOSCOPIC PROCEDURE TODAY AT THE [facility]:   Refer to the procedure report that was given to you for any specific questions about what was found during the examination.  If the procedure report does not answer your questions, please call your gastroenterologist to clarify.  If you requested that your care partner not be given the details of your procedure findings, then the procedure report has been included in a sealed envelope for you to review at your convenience later.  YOU SHOULD EXPECT: Some feelings of bloating in the abdomen. Passage of more gas than usual.  Walking can help get rid of the air that was put into your GI tract during the procedure and reduce the bloating. If you had a lower endoscopy (such as a colonoscopy or flexible sigmoidoscopy) you may notice spotting of blood in your stool or on the toilet paper. If you underwent a bowel prep for your procedure, you may not have a normal bowel movement for a few days.  Please Note:  You might notice some irritation and congestion in your nose or some drainage.  This is from the oxygen used during your procedure.  There is no need for concern and it should clear up in a day or so.  SYMPTOMS TO REPORT IMMEDIATELY:  Following lower endoscopy (colonoscopy or flexible sigmoidoscopy):  Excessive amounts of blood in the stool  Significant tenderness or worsening of abdominal pains  Swelling of the abdomen that is new, acute  Fever of 100F or higher  For urgent or emergent issues, a gastroenterologist can be reached at any hour by calling [phone]. Do not use MyChart messaging for urgent concerns.    DIET:  We do recommend a small meal at first, but then you may proceed to your regular diet.  Drink plenty of fluids but you should avoid alcoholic beverages for 24 hours.  ACTIVITY:  You should plan to take it easy for the rest of today and you should NOT DRIVE or use heavy  machinery until tomorrow (because of the sedation medicines used during the test).    FOLLOW UP: Our staff will call the number listed on your records 48-72 hours following your procedure to check on you and address any questions or concerns that you may have regarding the information given to you following your procedure. If we do not reach you, we will leave a message.  We will attempt to reach you two times.  During this call, we will ask if you have developed any symptoms of COVID 19. If you develop any symptoms (ie: fever, flu-like symptoms, shortness of breath, cough etc.) before then, please call [phone].  If you test positive for Covid 19 in the 2 weeks post procedure, please call and report this information to us.    If any biopsies were taken you will be contacted by phone or by letter within the next 1-3 weeks.  Please call us at [phone] if you have not heard about the biopsies in 3 weeks.    SIGNATURES/CONFIDENTIALITY: You and/or your care partner have signed paperwork which will be entered into your electronic medical record.  These signatures attest to the fact that that the information above on your After Visit Summary has been reviewed and is understood.  Full responsibility of the confidentiality of this discharge information lies with you and/or your care-partner.  

## 2020-02-07 NOTE — Op Note (Signed)
[place] Patient Name: Kristina Pierce Procedure Date: 02/07/2020 8:49 AM MRN: [id number] Endoscopist: [name] MD, MD Age: 44 Referring MD:  Date of Birth: 02-Jan-1976 Gender: Female Account #: [id number] Procedure:                Colonoscopy Indications:              Colon cancer screening in patient at increased                            risk: Family history of 1st-degree relative with                            colon polyps before age 1 years                           Heterozygous RNF43 Gene mutation                           Family history of colon polyps (mother <60)                           Family history of colon cancer (grandmother at age                            39) Medicines:                Monitored Anesthesia Care Procedure:                Pre-Anesthesia Assessment:                           - Prior to the procedure, a History and Physical                            was performed, and patient medications and                            allergies were reviewed. The patient's tolerance of                            previous anesthesia was also reviewed. The risks                            and benefits of the procedure and the sedation                            options and risks were discussed with the patient.                            All questions were answered, and informed consent                            was obtained. Prior Anticoagulants: The patient has  taken no previous anticoagulant or antiplatelet                            agents. ASA Grade Assessment: I - A normal, healthy                            patient. After reviewing the risks and benefits,                            the patient was deemed in satisfactory condition to                            undergo the procedure.                           After obtaining informed consent, the colonoscope                            was passed under direct vision.  Throughout the                            procedure, the patient's blood pressure, pulse, and                            oxygen saturations were monitored continuously. The                            Colonoscope was introduced through the anus and                            advanced to the 3 cm into the ileum. The                            colonoscopy was performed without difficulty. The                            patient tolerated the procedure well. The quality                            of the bowel preparation was good. The terminal                            ileum, ileocecal valve, appendiceal orifice, and                            rectum were photographed. Scope In: 9:01:23 AM Scope Out: 9:25:43 AM Scope Withdrawal Time: 0 hours 20 minutes 36 seconds  Total Procedure Duration: 0 hours 24 minutes 20 seconds  Findings:                 The perianal and digital rectal examinations were                            normal.  A 3 mm polyp was found in the descending colon, 40                            cm from the anal verge. The polyp was sessile. The                            polyp was removed with a cold snare. A small                            portion of the most outer edge of the polyp                            remained. This was too flat to remove with the                            snare and was removed with cold forceps. Resection                            and retrieval were complete. Estimated blood loss                            was minimal.                           A 2 mm polyp was found in the hepatic flexure. The                            polyp was flat. The polyp was removed with a cold                            snare. Resection and retrieval were complete.                            Estimated blood loss was minimal.                           The exam was otherwise without abnormality on                            direct and retroflexion  views. Complications:            No immediate complications. Estimated blood loss:                            Minimal. Estimated Blood Loss:     Estimated blood loss was minimal. Impression:               - One 3 mm polyp in the descending colon, removed                            with a cold snare. Resected and retrieved.                           -  One 2 mm polyp at the hepatic flexure, removed                            with a cold snare. Resected and retrieved.                           - The examination was otherwise normal on direct                            and retroflexion views. Recommendation:           - Patient has a contact number available for                            emergencies. The signs and symptoms of potential                            delayed complications were discussed with the                            patient. Return to normal activities tomorrow.                            Written discharge instructions were provided to the                            patient.                           - Resume previous diet.                           - Continue present medications.                           - Repeat colonoscopy date to be determined after                            pending pathology results are reviewed for                            surveillance.                           - Emerging evidence supports eating a diet of                            fruits, vegetables, grains, calcium, and yogurt                            while reducing red meat and alcohol may reduce the                            risk of colon cancer.                           -  Thank you for allowing me to be involved in your                            colon cancer prevention. [name] MD, MD 02/07/2020 9:35:06 AM This report has been signed electronically.

## 2020-02-07 NOTE — Progress Notes (Signed)
VS by CW. ?

## 2020-02-09 ENCOUNTER — Telehealth: Payer: Self-pay | Admitting: *Deleted

## 2020-02-09 ENCOUNTER — Telehealth: Payer: Self-pay

## 2020-02-09 NOTE — Telephone Encounter (Signed)
Follow up call made, no answer, left message 

## 2020-02-09 NOTE — Telephone Encounter (Signed)
  Follow up Call-  Call back number 02/07/2020  Post procedure Call Back phone  # [phone]  Permission to leave phone message Yes  Some recent data might be hidden     Patient questions:  Do you have a fever, pain , or abdominal swelling? No. Pain Score  0 *  Have you tolerated food without any problems? Yes.    Have you been able to return to your normal activities? Yes.    Do you have any questions about your discharge instructions: Diet   No. Medications  No. Follow up visit  No.  Do you have questions or concerns about your Care? No.  Actions: * If pain score is 4 or above: No action needed, pain <4.  1. Have you developed a fever since your procedure? no  2.   Have you had an respiratory symptoms (SOB or cough) since your procedure? no  3.   Have you tested positive for COVID 19 since your procedure no  4.   Have you had any family members/close contacts diagnosed with the COVID 19 since your procedure?  no   If yes to any of these questions please route to Joylene John, RN and Erenest Rasher, RN

## 2020-04-07 NOTE — Telephone Encounter (Signed)
Reminder in epic °

## 2020-07-14 ENCOUNTER — Encounter (HOSPITAL_BASED_OUTPATIENT_CLINIC_OR_DEPARTMENT_OTHER): Payer: Self-pay | Admitting: *Deleted

## 2020-07-14 ENCOUNTER — Other Ambulatory Visit: Payer: Self-pay

## 2020-07-14 ENCOUNTER — Emergency Department (HOSPITAL_BASED_OUTPATIENT_CLINIC_OR_DEPARTMENT_OTHER)
Admission: EM | Admit: 2020-07-14 | Discharge: 2020-07-14 | Disposition: A | Discharge status: Home / Self Care | Payer: BC Managed Care – PPO | Attending: Emergency Medicine | Admitting: Emergency Medicine

## 2020-07-14 DIAGNOSIS — M542 Cervicalgia: Secondary | ICD-10-CM | POA: Diagnosis present

## 2020-07-14 DIAGNOSIS — M5412 Radiculopathy, cervical region: Secondary | ICD-10-CM | POA: Diagnosis not present

## 2020-07-14 MED ORDER — METHYLPREDNISOLONE 4 MG PO TBPK
ORAL_TABLET | ORAL | 0 refills | Status: DC
Start: 2020-07-14 — End: 2021-06-14

## 2020-07-14 MED ORDER — METHYLPREDNISOLONE 4 MG PO TBPK
ORAL_TABLET | ORAL | 0 refills | Status: DC
Start: 2020-07-14 — End: 2020-07-14

## 2020-07-14 MED ORDER — LIDOCAINE 5 % EX PTCH: 1.0000 | MEDICATED_PATCH | CUTANEOUS | 0 refills | Status: DC

## 2020-07-14 MED ORDER — METHOCARBAMOL 500 MG PO TABS
500.0000 mg | ORAL_TABLET | Freq: Two times a day (BID) | ORAL | 0 refills | Status: AC
Start: 2020-07-14 — End: 2020-07-21

## 2020-07-14 NOTE — ED Triage Notes (Signed)
She woke this am with pain in the right side of her neck and limited movement. She is ambulatory.

## 2020-07-14 NOTE — Discharge Instructions (Signed)
At this time there does not appear to be the presence of an emergent medical condition, however there is always the potential for conditions to change. Please read and follow the below instructions.  Please return to the Emergency Department immediately for any new or worsening symptoms. Please be sure to follow up with your Primary Care Provider within one week regarding your visit today; please call their office to schedule an appointment even if you are feeling better for a follow-up visit. You may use the muscle relaxer Robaxin as prescribed to help with your symptoms.  Do not drive or operate heavy machinery while taking Robaxin as it will make you drowsy.  Do not drink alcohol or take other sedating medications while taking Robaxin as this will worsen side effects. You may use the Lidoderm patch as prescribed to help with your symptoms.  Lidoderm may be expensive so you may speak with your pharmacist about finding over-the-counter medications that work similarly such as [place]. Please use the steroid medication Medrol Dosepak as instructed to help with your symptoms.  Go to the nearest Emergency Department immediately if: You have fever or chills You lose feeling or feel weak in your hand, arm, face, or leg. You have headache or vision changes. You have a stiff neck. You cannot control when you poop or pee (have incontinence). You have trouble with walking, balance, or talking. You have any new/concerning or worsening of symptoms  Please read the additional information packets attached to your discharge summary.  Do not take your medicine if  develop an itchy rash, swelling in your mouth or lips, or difficulty breathing; call 911 and seek immediate emergency medical attention if this occurs.  You may review your lab tests and imaging results in their entirety on your MyChart account.  Please discuss all results of fully with your primary care provider and other specialist at your  follow-up visit.  Note: Portions of this text may have been transcribed using voice recognition software. Every effort was made to ensure accuracy; however, inadvertent computerized transcription errors may still be present.

## 2020-07-14 NOTE — ED Provider Notes (Signed)
[facility] EMERGENCY DEPARTMENT Provider Note   CSN: [id number] Arrival date & time: 07/14/20  1720     History Chief Complaint  Patient presents with  . Neck Pain    Kristina Pierce is a 44 y.o. female who reports a history of kidney stones otherwise healthy no daily medication use.  Patient presents today for right-sided neck pain onset this a.m. when she woke up.  She denies any injury or trauma that she can recall.  She reports when she woke up she noticed an aching pain to the right side of her neck and right upper back it was moderate in intensity constant worsened with turning her head and slightly improved with rest, pain did not radiate.  She noticed some paresthesias around her right shoulder at that time as well.  As the day progressed the pain worsened and she had occasional paresthesias to her index and middle finger of her right hand.  Denies fall/injury, fever/chills, headache, vision changes, sore throat, cough/hemoptysis, recent illness, chest pain/sob, abdominal pain, nausea/vomiting, weakness, difficulty speaking, confusion, balance issues or any additional concerns.  HPI     Past Medical History:  Diagnosis Date  . Allergy   . Chicken pox   . Kidney stones   . Migraine   . Neuropathy   . Urine incontinence     Patient Active Problem List   Diagnosis Date Noted  . Preventative health care 11/30/2013  . Paresthesia of both hands 11/30/2013  . Paresthesia of bilateral legs 11/30/2013  . Urinary incontinence 11/30/2013  . Kidney stones 11/30/2013  . Morton's neuroma of left foot 11/30/2013    Past Surgical History:  Procedure Laterality Date  . APPENDECTOMY  2012  . kidney stone removal  2011  . TONSILLECTOMY  1996  . WISDOM TOOTH EXTRACTION       OB History    Gravida  2   Para  2   Term  2   Preterm  0   AB  0   Living  2     SAB  0   TAB  0   Ectopic  0   Multiple  0   Live Births  2           Family  History  Problem Relation Age of Onset  . Dystonia Mother        cervical dystonia from trauma  . Kidney disease Mother        stones  . Breast cancer Mother   . Arthritis Mother   . Hypertension Father   . Hyperlipidemia Father   . Other Father        amyloidosis  . Hyperlipidemia Sister   . Kidney disease Sister        stones  . Other Sister        hearing loss, etio?  . ALS Paternal Uncle   . Multiple sclerosis Paternal Uncle   . Diabetes Paternal Uncle   . Kidney cancer Maternal Grandfather   . Heart disease Paternal Grandmother   . Heart disease Paternal Grandfather   . Colon cancer Maternal Grandmother   . Esophageal cancer Neg Hx   . Rectal cancer Neg Hx   . Stomach cancer Neg Hx     Social History   Tobacco Use  . Smoking status: Never Smoker  . Smokeless tobacco: Never Used  Vaping Use  . Vaping Use: Never used  Substance Use Topics  . Alcohol use: No  . Drug  use: No    Home Medications Prior to Admission medications   Medication Sig Start Date End Date Taking? Authorizing Provider  JUNEL FE 1/20 1-20 MG-MCG tablet Take 1 tablet by mouth daily. 09/19/19  Yes [provider]  Multiple Vitamin (MULTIVITAMIN) tablet Take 1 tablet by mouth daily.   Yes [provider]  famotidine (PEPCID) 20 MG tablet Take 1 tablet (20 mg total) by mouth 2 (two) times daily. 12/02/19   [name], MD  fluticasone (FLONASE) 50 MCG/ACT nasal spray Place 2 sprays into both nostrils daily. 08/14/18   Kuneff, Renee A, DO  lidocaine (LIDODERM) 5 % Place 1 patch onto the skin daily. Remove & Discard patch within 12 hours or as directed by MD 07/14/20   Deliah Boston, PA-C  methocarbamol (ROBAXIN) 500 MG tablet Take 1 tablet (500 mg total) by mouth 2 (two) times daily for 7 days. 07/14/20 07/21/20  Nuala Alpha A, PA-C  methylPREDNISolone (MEDROL DOSEPAK) 4 MG TBPK tablet Per DosePak instructions. 07/14/20   Nuala Alpha A, PA-C  Na Sulfate-K  Sulfate-Mg Sulf 17.5-3.13-1.6 GM/177ML SOLN Take as directed 01/26/20   [name], MD    Allergies    Goat milk, Goat-derived products, and Chlorhexidine  Review of Systems   Review of Systems Ten systems are reviewed and are negative for acute change except as noted in the HPI  Physical Exam Updated Vital Signs BP (!) 148/90   Pulse 86   Temp 98.3 F (36.8 C) (Oral)   Resp 20   Ht 5\' 5"  (1.651 m)   Wt 76.2 kg   LMP 07/12/2020   SpO2 100%   BMI 27.96 kg/m   Physical Exam Constitutional:      General: She is not in acute distress.    Appearance: Normal appearance. She is well-developed. She is not ill-appearing or diaphoretic.  HENT:     Head: Normocephalic and atraumatic.  Eyes:     General: Vision grossly intact. Gaze aligned appropriately.     Pupils: Pupils are equal, round, and reactive to light.  Neck:     Trachea: Trachea and phonation normal.  Cardiovascular:     Rate and Rhythm: Normal rate and regular rhythm.     Pulses:          Radial pulses are 2+ on the right side and 2+ on the left side.  Pulmonary:     Effort: Pulmonary effort is normal. No respiratory distress.  Abdominal:     General: There is no distension.     Palpations: Abdomen is soft.     Tenderness: There is no abdominal tenderness. There is no guarding or rebound.  Musculoskeletal:        General: Normal range of motion.     Cervical back: Normal range of motion.       Back:     Comments: No midline C/T/L spinal tenderness to palpation, no deformity, crepitus, or step-off noted. No sign of injury to the neck or back. - Right paraspinal muscular tenderness without overlying skin change.  Point tenderness and spasm noted at the right rhomboid and trapezius muscles. - No tenderness to palpation of the shoulder, full range of motion of the right shoulder without crepitus or deformity.  Full strength with shoulder movements, elbow hand and wrist movements.  Skin:    General: Skin is  warm and dry.  Neurological:     Mental Status: She is alert.     GCS: GCS eye subscore is 4.  GCS verbal subscore is 5. GCS motor subscore is 6.     Comments: Mental Status: Alert, oriented, thought content appropriate, able to give a coherent history. Speech fluent without evidence of aphasia. Able to follow 2 step commands without difficulty. Cranial Nerves: II: Peripheral visual fields grossly normal, pupils equal, round, reactive to light III,IV, VI: ptosis not present, extra-ocular motions intact bilaterally V,VII: smile symmetric, eyebrows raise symmetric, facial light touch sensation equal VIII: hearing grossly normal to voice X: uvula elevates symmetrically XI: bilateral shoulder shrug symmetric and strong XII: midline tongue extension without fassiculations Motor: Normal tone. 5/5 strength in upper and lower extremities bilaterally including strong and equal grip strength and dorsiflexion/plantar flexion Sensory: Sensation intact to light touch in all extremities.Negative Romberg.  Cerebellar: normal finger-to-nose with bilateral upper extremities. Normal heel-to -shin balance bilaterally of the lower extremity. No pronator drift.  Gait: normal gait and balance CV: distal pulses palpable throughout  Psychiatric:        Behavior: Behavior normal.     ED Results / Procedures / Treatments   Labs (all labs ordered are listed, but only abnormal results are displayed) Labs Reviewed - No data to display  EKG None  Radiology No results found.  Procedures Procedures (including critical care time)  Medications Ordered in ED Medications - No data to display  ED Course  I have reviewed the triage vital signs and the nursing notes.  Pertinent labs & imaging results that were available during my care of the patient were reviewed by me and considered in my medical decision making (see chart for details).    MDM Rules/Calculators/A&P                           Additional history obtained from: 1. Nursing notes from this visit. ------------------------------ 44 year old female presented for right-sided neck pain that began this morning after she woke up.  She has some associated paresthesias which appear to be primarily in the distribution of the right median nerve.  She denies any history of injury or trauma to the area or infectious symptoms additionally no chest pain or shortness of breath.  On exam she has no midline spinal tenderness crepitus step-off or deformity.  Patient has a normal neurologic exam.  She has point tenderness and spasm of the right trapezius and right rhomboid muscles.  When the right trapezius muscle is palpated and her pain increases this can cause the paresthesia of the right fingers to return, paresthesias resolved after pain resolved.  Suspect patient with muscular pain today causing radiculopathy.  Doubt CNS pathology, meningitis, ACS, fracture/dislocation or other emergent pathology of her symptoms today. NEXUS negative. Shared decision-making was made with patient and she was offered CT imaging of the cervical spine but she refused that she wishes to proceed with symptomatic treatment and outpatient follow-up.  Feel this is a reasonable course of action as suspect patient symptoms are secondary to muscle spasm and local inflammation at this time.  Discussed case with attending physician Dr. Vallery Ridge who agrees with plan and discharge at this time, will provide muscle relaxer, Medrol Dosepak and Lidoderm patches.  Discussed above medications with the patient she is agreeable to treatment, denies any history of diabetes or adverse reaction steroids, additionally she states understanding of muscle relaxer precautions.  At this time there does not appear to be any evidence of an acute emergency medical condition and the patient appears stable for discharge with appropriate outpatient follow  up. Diagnosis was discussed with patient who  verbalizes understanding of care plan and is agreeable to discharge. I have discussed return precautions with patient who verbalizes understanding. Patient encouraged to follow-up with their PCP and ortho. All questions answered.   Note: Portions of this report may have been transcribed using voice recognition software. Every effort was made to ensure accuracy; however, inadvertent computerized transcription errors may still be present. Final Clinical Impression(s) / ED Diagnoses Final diagnoses:  Neck pain  Cervical radiculopathy    Rx / DC Orders ED Discharge Orders         Ordered    methylPREDNISolone (MEDROL DOSEPAK) 4 MG TBPK tablet        07/14/20 1839    methocarbamol (ROBAXIN) 500 MG tablet  2 times daily        07/14/20 1839    lidocaine (LIDODERM) 5 %  Every 24 hours        07/14/20 1839           Gari Crown 07/14/20 1842    Charlesetta Shanks, MD 07/15/20 1546

## 2020-07-18 DIAGNOSIS — M542 Cervicalgia: Secondary | ICD-10-CM

## 2020-07-18 DIAGNOSIS — M25511 Pain in right shoulder: Secondary | ICD-10-CM | POA: Insufficient documentation

## 2020-07-18 HISTORY — DX: Cervicalgia: M54.2

## 2020-07-18 HISTORY — DX: Pain in right shoulder: M25.511

## 2020-07-18 HISTORY — PX: CERVICAL SPINE SURGERY: SHX589

## 2020-08-02 ENCOUNTER — Other Ambulatory Visit: Payer: Self-pay

## 2020-08-16 ENCOUNTER — Encounter: Payer: Self-pay | Admitting: Family Medicine

## 2020-08-16 ENCOUNTER — Telehealth (INDEPENDENT_AMBULATORY_CARE_PROVIDER_SITE_OTHER): Payer: BC Managed Care – PPO | Admitting: Family Medicine

## 2020-08-16 VITALS — BP 119/93 | HR 93 | Temp 98.5°F | Ht 65.0 in | Wt 161.5 lb

## 2020-08-16 DIAGNOSIS — L739 Follicular disorder, unspecified: Secondary | ICD-10-CM | POA: Diagnosis not present

## 2020-08-16 MED ORDER — DOXYCYCLINE HYCLATE 100 MG PO TABS
100.0000 mg | ORAL_TABLET | Freq: Two times a day (BID) | ORAL | 0 refills | Status: DC
Start: 2020-08-16 — End: 2021-06-14

## 2020-08-16 MED ORDER — MUPIROCIN 2 % EX OINT: 1.0000 "application " | TOPICAL_OINTMENT | Freq: Two times a day (BID) | CUTANEOUS | 2 refills | Status: DC

## 2020-08-16 NOTE — Patient Instructions (Signed)
MRSA Infection, Diagnosis, Adult Methicillin-resistant Staphylococcus aureus (MRSA) infection is caused by bacteria called Staphylococcus aureus, or staph, that no longer respond to common antibiotic medicines (drug-resistant bacteria). MRSA infection can be hard to treat. Most of the time, MRSA can be on the skin or in the nose without causing problems (colonized). However, if MRSA enters the body through a cut, a sore, or an invasive medical device, it can cause a serious infection. What are the causes? This condition is caused by staph bacteria. Illness may develop after exposure to the bacteria through:  Skin-to-skin contact with someone who is infected with MRSA.  Touching surfaces that have the bacteria on them.  Having a procedure or using equipment that allows MRSA to enter the body.  Having MRSA that lives on your skin and then enters your body through: ? A cut or scratch. ? A surgery or procedure. ? The use of a medical device. Contact with the bacteria may occur:  During a stay in a hospital, rehabilitation facility, nursing home, or other health care facility (health care-associated MRSA).  In daily activities where there is close contact with others, such as sports, child care centers, or at home (community-associated MRSA). What increases the risk? You are more likely to develop this condition if you:  Have a surgery or procedure.  Have an IV or a thin tube (catheter) placed in your body.  Are elderly.  Are on kidney dialysis.  Have recently taken an antibiotic medicine.  Live in a long-term care facility.  Have a chronic wound or skin ulcer.  Have a weak body defense system (immune system).  Play sports that involve skin-to-skin contact.  Live in a crowded place, like a dormitory or [employer]  Share towels, razors, or sports equipment with other people.  Have a history of MRSA infection or colonization. What are the signs or symptoms? Symptoms  of this condition depend on the area that is affected. Symptoms may include:  A pus-filled pimple or boil.  Pus that drains from your skin.  A sore (abscess) under your skin or somewhere in your body.  Fever with or without chills.  Difficulty breathing.  Coughing up blood.  Redness, warmth, swelling, or pain in the affected area. How is this diagnosed? This condition may be diagnosed based on:  A physical exam.  Your medical history.  Taking a sample from the infected area and growing it in a lab (culture). You may also have other tests, including:  Imaging tests, such as X-rays, a CT scan, or an MRI.  Lab tests, such as blood, urine, or phlegm (sputum) tests. You skin or nose may be swabbed when you are admitted to a health care facility for a procedure. This is to screen for MRSA. How is this treated? Treatment depends on the type of MRSA infection you have and how severe, deep, or extensive it is. Treatment may include:  Antibiotic medicines.  Surgery to drain pus from the infected area. Severe infections may require a hospital stay. Follow these instructions at home: Medicines  Take over-the-counter and prescription medicines only as told by your health care provider.  If you were prescribed an antibiotic medicine, use it as told by your health care provider. Do not stop using the antibiotic even if you start to feel better. Prevention Follow these instructions to avoid spreading the infection to others:  Wash your hands frequently with soap and water. If soap and water are not available, use an alcohol-based hand sanitizer.  Avoid close contact with those around you as much as possible. Do not use towels, razors, toothbrushes, bedding, or other items that will be used by others.  Wash towels, bedding, and clothes in the washing machine with detergent and hot water. Dry them in a hot dryer.  Clean surfaces regularly to remove germs (disinfection). Use products  or solutions that contain bleach. Make sure you disinfect bathroom surfaces, food preparation areas, exercise equipment, and doorknobs.  General instructions  If you have a wound, follow instructions from your health care provider about how to take care of your wound. ? Do not pick at scabs. ? Do not try to drain any infection sites or pimples.  Tell all your health care providers that you have MRSA, or if you have ever had a MRSA infection.  Keep all follow-up visits as told by your health care provider. This is important. Contact a health care provider if you:  Do not get better.  Have symptoms that get worse.  Have new symptoms. Get help right away if you have:  Nausea or vomiting, or if you cannot take medicine without vomiting.  Trouble breathing.  Chest pain. These symptoms may represent a serious problem that is an emergency. Do not wait to see if the symptoms will go away. Get medical help right away. Call your local emergency services (911 in the [place] Do not drive yourself to the hospital. Summary  MRSA infection is caused by bacteria called Staphylococcus aureus, or staph, that no longer respond to common antibiotic medicines.  Treatment for this condition depends on the type of MRSA infection you have and how severe, deep, and extensive it is.  If you were prescribed an antibiotic medicine, use it as told by your health care provider. Do not stop using the antibiotic even if you start to feel better.  Follow instructions from your health care provider to avoid spreading the infection to others. This information is not intended to replace advice given to you by your health care provider. Make sure you discuss any questions you have with your health care provider. Document Revised: 10/22/2018 Document Reviewed: 10/23/2018 Elsevier Patient Education  2020 [employer]  

## 2020-08-16 NOTE — Progress Notes (Signed)
VIRTUAL VISIT VIA VIDEO  I connected with Kristina Pierce on 08/16/20 at  9:00 AM EST by elemedicine application and verified that I am speaking with the correct person using two identifiers. Location patient: Home Location provider: [facility], Office Persons participating in the virtual visit: Patient, Dr. Claiborne Billings and \ J.Vito Backers, CMA   I discussed the limitations of evaluation and management by telemedicine and the availability of in person appointments. The patient expressed understanding and agreed to proceed.   SUBJECTIVE Chief Complaint  Patient presents with  . folliculitis    Appeared after her surgery on 08/10/2020 very close to incision and is afraid that it might get infected has been applying     HPI: Kristina Pierce is a 44 y.o. female present for acute concern. She reports a rash on the anterior portion of her neck today. She reports the rash initially started after 14 days of prednisone. Then she had surgery for her cervical disc bulging 08/10/2020. She was given IV abx prior to surgery and rash almost immediately resolved. After surgery she was only using polysporin. Her surgeon felt it was folliculitis.  She is allergic to Hibiclens.  Her husband (now deceased), had MRSA infections.   ROS: See pertinent positives and negatives per HPI.  Patient Active Problem List   Diagnosis Date Noted  . Neck pain 07/18/2020  . Pain in joint of right shoulder 07/18/2020  . Preventative health care 11/30/2013  . Paresthesia of both hands 11/30/2013  . Paresthesia of both lower extremities 11/30/2013  . Urinary incontinence 11/30/2013  . Kidney stones 11/30/2013  . Morton's neuroma of left foot 11/30/2013    Social History   Tobacco Use  . Smoking status: Never Smoker  . Smokeless tobacco: Never Used  Substance Use Topics  . Alcohol use: No    Current Outpatient Medications:  .  doxycycline (VIBRA-TABS) 100 MG tablet, Take 1 tablet (100 mg total)  by mouth 2 (two) times daily., Disp: 14 tablet, Rfl: 0 .  famotidine (PEPCID) 20 MG tablet, Take 1 tablet (20 mg total) by mouth 2 (two) times daily., Disp: 60 tablet, Rfl: 2 .  fluticasone (FLONASE) 50 MCG/ACT nasal spray, Place 2 sprays into both nostrils daily., Disp: 16 g, Rfl: 6 .  gabapentin (NEURONTIN) 100 MG capsule, gabapentin 100 mg capsule  Take 1 capsule every day by oral route for 15 days., Disp: , Rfl:  .  gabapentin (NEURONTIN) 300 MG capsule, gabapentin 300 mg capsule, Disp: , Rfl:  .  JUNEL FE 1/20 1-20 MG-MCG tablet, Take 1 tablet by mouth daily., Disp: , Rfl:  .  lidocaine (LIDODERM) 5 %, Place 1 patch onto the skin daily. Remove & Discard patch within 12 hours or as directed by MD, Disp: 15 patch, Rfl: 0 .  methocarbamol (ROBAXIN) 500 MG tablet, methocarbamol 500 mg tablet, Disp: , Rfl:  .  methylPREDNISolone (MEDROL DOSEPAK) 4 MG TBPK tablet, Per DosePak instructions., Disp: 21 tablet, Rfl: 0 .  Multiple Vitamin (MULTIVITAMIN) tablet, Take 1 tablet by mouth daily., Disp: , Rfl:  .  mupirocin ointment (BACTROBAN) 2 %, Apply 1 application topically 2 (two) times daily., Disp: 30 g, Rfl: 2 .  Na Sulfate-K Sulfate-Mg Sulf 17.5-3.13-1.6 GM/177ML SOLN, Take as directed, Disp: 354 mL, Rfl: 0 .  predniSONE (DELTASONE) 10 MG tablet, prednisone 10 mg tablet  40mg  daily x 2 days, 30mg  daily x 2 days, 20mg  x 2 day, 10mg  x 2 day, Disp: , Rfl:  Allergies  Allergen Reactions  . Goat Milk Hives  . Goat-Derived Conservation officer, nature  . Chlorhexidine Itching and Rash    OBJECTIVE: BP (!) 119/93 (BP Location: Left Arm, Patient Position: Sitting, Cuff Size: Large)   Pulse 93   Temp 98.5 F (36.9 C) (Oral)   Ht 5\' 5"  (1.651 m)   Wt 161 lb 8 oz (73.3 kg)   BMI 26.88 kg/m  Gen: No acute distress. Nontoxic in appearance.  HENT: AT. .  MMM.  Skin: fine small pustular rash anterior neck x3 pustules noted. No erythema or drainage.   ASSESSMENT AND PLAN: Kristina Pierce is a 44 y.o.  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 Unfortunately, she is allergic to hibiclens. Encouraged her to wash daily with an antibacteria soap of choice.  Bactroban ointment BID to pustules (and nares x7 days) Doxy BID x7 days F/u PRN    59, DO 08/16/2020   No follow-ups on file.  No orders of the defined types were placed in this encounter.  Meds ordered this encounter  Medications  . mupirocin ointment (BACTROBAN) 2 %    Sig: Apply 1 application topically 2 (two) times daily.    Dispense:  30 g    Refill:  2  . doxycycline (VIBRA-TABS) 100 MG tablet    Sig: Take 1 tablet (100 mg total) by mouth 2 (two) times daily.    Dispense:  14 tablet    Refill:  0   Referral Orders  No referral(s) requested today

## 2021-01-10 LAB — HM MAMMOGRAPHY

## 2021-04-29 ENCOUNTER — Telehealth: Payer: Self-pay | Admitting: Gastroenterology

## 2021-04-29 NOTE — Telephone Encounter (Signed)
Records from [facility] analysis and [facility] Genetic testing showed no clinically mutation findings. 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   No change in recommendations. Repeat colonoscopy in 3 years. Hopefully she can also obtain information about her mother's polyp histology.

## 2021-05-10 ENCOUNTER — Encounter: Payer: BC Managed Care – PPO | Admitting: Family Medicine

## 2021-05-31 ENCOUNTER — Encounter: Payer: Self-pay | Admitting: Family Medicine

## 2021-06-14 ENCOUNTER — Encounter: Payer: Self-pay | Admitting: Family Medicine

## 2021-06-14 ENCOUNTER — Telehealth (INDEPENDENT_AMBULATORY_CARE_PROVIDER_SITE_OTHER): Payer: Self-pay | Admitting: Family Medicine

## 2021-06-14 VITALS — Temp 98.6°F | Wt 169.0 lb

## 2021-06-14 DIAGNOSIS — J111 Influenza due to unidentified influenza virus with other respiratory manifestations: Secondary | ICD-10-CM

## 2021-06-14 DIAGNOSIS — Z20828 Contact with and (suspected) exposure to other viral communicable diseases: Secondary | ICD-10-CM

## 2021-06-14 MED ORDER — OSELTAMIVIR PHOSPHATE 75 MG PO CAPS: ORAL_CAPSULE | ORAL | 0 refills | Status: DC

## 2021-06-14 NOTE — Progress Notes (Signed)
Virtual Visit via Video Note  I connected with pt on 06/14/21 at  1:30 PM EDT by a video enabled telemedicine application and verified that I am speaking with the correct person using two identifiers.  Location patient: home, [place] Location provider:work or home office Persons participating in the virtual visit: patient, provider  I discussed the limitations of evaluation and management by telemedicine and the availability of in person appointments. The patient expressed understanding and agreed to proceed.  Telemedicine visit is a necessity given the COVID-19 restrictions in place at the current time.  HPI: 45 y/o female being seen today for respiratory symptoms. 24 to 36 hours history of malaise, body aches, fatigue, and headache.  No fever. Some nasal congestion and dry cough as well.  No appetite but no nausea vomiting diarrhea or abdominal pain. Her daughter was diagnosed with flu A within the last few days. Pt has had flu vaccine on 05/23/21. No home covid test has been done.  ROS: See pertinent positives and negatives per HPI.  Past Medical History:  Diagnosis Date   Allergy    Chicken pox    Elevated blood pressure reading without diagnosis of hypertension    Kidney stones    Migraine    Neuropathy    Radiculopathy of cervical spine    Urine incontinence     Past Surgical History:  Procedure Laterality Date   APPENDECTOMY  2012   kidney stone removal  2011   TONSILLECTOMY  1996   WISDOM TOOTH EXTRACTION       Current Outpatient Medications:    JUNEL FE 1/20 1-20 MG-MCG tablet, Take 1 tablet by mouth daily., Disp: , Rfl:    Multiple Vitamin (MULTIVITAMIN) tablet, Take 1 tablet by mouth daily., Disp: , Rfl:    famotidine (PEPCID) 20 MG tablet, Take 1 tablet (20 mg total) by mouth 2 (two) times daily. (Patient not taking: Reported on 06/14/2021), Disp: 60 tablet, Rfl: 2   gabapentin (NEURONTIN) 300 MG capsule, gabapentin 300 mg capsule (Patient not taking: Reported on  06/14/2021), Disp: , Rfl:    HYDROcodone-acetaminophen (NORCO/VICODIN) 5-325 MG tablet, Take by mouth. (Patient not taking: Reported on 06/14/2021), Disp: , Rfl:    mupirocin ointment (BACTROBAN) 2 %, Apply 1 application topically 2 (two) times daily. (Patient not taking: Reported on 06/14/2021), Disp: 30 g, Rfl: 2  EXAM:  VITALS per patient if applicable:  Vitals with BMI 06/14/2021 08/16/2020 07/14/2020  Height - 5\' 5"  -  Weight 169 lbs 161 lbs 8 oz -  BMI - 45.80 -  Systolic - 998 338  Diastolic - 93 71  Pulse - 93 81     GENERAL: alert, oriented, appears well and in no acute distress  HEENT: atraumatic, conjunttiva clear, no obvious abnormalities on inspection of external nose and ears  NECK: normal movements of the head and neck  LUNGS: on inspection no signs of respiratory distress, breathing rate appears normal, no obvious gross SOB, gasping or wheezing  CV: no obvious cyanosis  MS: moves all visible extremities without noticeable abnormality  PSYCH/NEURO: pleasant and cooperative, no obvious depression or anxiety, speech and thought processing grossly intact  LABS: none today    Chemistry      Component Value Date/Time   NA 139 04/30/2018 0937   K 4.3 04/30/2018 0937   CL 105 04/30/2018 0937   CO2 29 04/30/2018 0937   BUN 17 04/30/2018 0937   CREATININE 0.72 04/30/2018 0937      Component Value Date/Time  CALCIUM 9.1 04/30/2018 0937   ALKPHOS 52 04/30/2018 0937   AST 15 04/30/2018 0937   ALT 17 04/30/2018 0937   BILITOT 0.7 04/30/2018 6886     Lab Results  Component Value Date   HGBA1C 5.5 04/30/2018   ASSESSMENT AND PLAN:  Discussed the following assessment and plan:  Influenza-like illness. +contact with daughter with Influenza A. Discussed supportive care with otc symptomatic meds, fluids. Tamiflu 75mg  bid x 5d rx'd today. Discussed quarantine precautions and recommended testing for covid at home as well.  I discussed the assessment and  treatment plan with the patient. The patient was provided an opportunity to ask questions and all were answered. The patient agreed with the plan and demonstrated an understanding of the instructions.   F/u: if not improving signif in 4-5d  Signed:  Crissie Sickles, MD           06/14/2021

## 2021-07-09 ENCOUNTER — Encounter: Payer: Self-pay | Admitting: Family Medicine

## 2021-07-09 ENCOUNTER — Other Ambulatory Visit: Payer: Self-pay

## 2021-07-09 ENCOUNTER — Ambulatory Visit (INDEPENDENT_AMBULATORY_CARE_PROVIDER_SITE_OTHER): Payer: BC Managed Care – PPO | Admitting: Family Medicine

## 2021-07-09 VITALS — BP 119/81 | HR 66 | Temp 98.2°F | Ht 65.5 in | Wt 168.0 lb

## 2021-07-09 DIAGNOSIS — Z1159 Encounter for screening for other viral diseases: Secondary | ICD-10-CM

## 2021-07-09 DIAGNOSIS — Z0000 Encounter for general adult medical examination without abnormal findings: Secondary | ICD-10-CM

## 2021-07-09 DIAGNOSIS — Z131 Encounter for screening for diabetes mellitus: Secondary | ICD-10-CM | POA: Diagnosis not present

## 2021-07-09 DIAGNOSIS — Z13220 Encounter for screening for lipoid disorders: Secondary | ICD-10-CM

## 2021-07-09 DIAGNOSIS — Z793 Long term (current) use of hormonal contraceptives: Secondary | ICD-10-CM | POA: Diagnosis not present

## 2021-07-09 LAB — COMPREHENSIVE METABOLIC PANEL
ALT: 10 U/L (ref 0–35)
AST: 14 U/L (ref 0–37)
Albumin: 4.2 g/dL (ref 3.5–5.2)
Alkaline Phosphatase: 45 U/L (ref 39–117)
BUN: 13 mg/dL (ref 6–23)
CO2: 29 mEq/L (ref 19–32)
Calcium: 9.2 mg/dL (ref 8.4–10.5)
Chloride: 104 mEq/L (ref 96–112)
Creatinine, Ser: 0.68 mg/dL (ref 0.40–1.20)
GFR: 104.96 mL/min (ref >60.00)
Glucose, Bld: 80 mg/dL (ref 70–99)
Potassium: 3.9 mEq/L (ref 3.5–5.1)
Sodium: 139 mEq/L (ref 135–145)
Total Bilirubin: 0.8 mg/dL (ref 0.2–1.2)
Total Protein: 7.1 g/dL (ref 6.0–8.3)

## 2021-07-09 LAB — LIPID PANEL
Cholesterol: 218 mg/dL — ABNORMAL HIGH (ref 0–200)
HDL: 55.4 mg/dL (ref >39.00)
LDL Cholesterol: 138 mg/dL — ABNORMAL HIGH (ref 0–99)
NonHDL: 162.46
Total CHOL/HDL Ratio: 4
Triglycerides: 123 mg/dL (ref 0.0–149.0)
VLDL: 24.6 mg/dL (ref 0.0–40.0)

## 2021-07-09 LAB — CBC
HCT: 41.1 % (ref 36.0–46.0)
Hemoglobin: 13.7 g/dL (ref 12.0–15.0)
MCHC: 33.4 g/dL (ref 30.0–36.0)
MCV: 95.9 fl (ref 78.0–100.0)
Platelets: 250 10*3/uL (ref 150.0–400.0)
RBC: 4.29 Mil/uL (ref 3.87–5.11)
RDW: 12.9 % (ref 11.5–15.5)
WBC: 6.7 10*3/uL (ref 4.0–10.5)

## 2021-07-09 LAB — HEMOGLOBIN A1C: Hgb A1c MFr Bld: 5.5 % (ref 4.6–6.5)

## 2021-07-09 NOTE — Progress Notes (Signed)
This visit occurred during the SARS-CoV-2 public health emergency.  Safety protocols were in place, including screening questions prior to the visit, additional usage of staff PPE, and extensive cleaning of exam room while observing appropriate contact time as indicated for disinfecting solutions.    Patient ID: Kristina Pierce, female  DOB: [birth year], 45 y.o.   MRN: [id number] Patient Care Team    Relationship Specialty Notifications Start End  [name], DO PCP - General Family Medicine  12/02/19   [name], MD Consulting Physician Urology  08/14/16   [name], MD Consulting Physician Obstetrics and Gynecology  04/30/18   [name], MD Consulting Physician Gastroenterology  07/09/21     Chief Complaint  Patient presents with   Annual Exam    Pt is fasting;     Subjective: Kristina Pierce is a 45 y.o.  Female  present for CPE. All past medical history, surgical history, allergies, family history, immunizations, medications and social history were updated in the electronic medical record today. All recent labs, ED visits and hospitalizations within the last year were reviewed.  Health maintenance:  Colonoscopy: Fhx colon cancer MGM (11). Completed 01/2020- 3 yr. Dr. Tarri Glenn Mammogram: FHX breast cancer in mother (3).  completed:2022- gyn Cervical cancer screening: last pap: 08/2017, completed by: Dr. Matthew Saras Immunizations: tdap 2018 UTD, Influenza UTD. Covid UTD Infectious disease screening: HIV completed 2015. Hep C agreeable.  DEXA: routine screen at 60 Assistive device: no Oxygen use:no Patient has a Dental home. Hospitalizations/ED visits: reviewed  Depression screen [facility] 2/9 07/09/2021 04/30/2018 08/14/2016  Decreased Interest 0 0 0  Down, Depressed, Hopeless 0 0 0  PHQ - 2 Score 0 0 0   No flowsheet data found.  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 Berenice Primas. Tried systemic prednisone-unable to tolerate SE-HA, dizzy. Discussed steroid injection.   Neck pain 07/18/2020   Neuropathy    Pain in joint of right shoulder 07/18/2020   Paresthesia of both hands 11/30/2013   Duration few years, intermittent. More intense last 14 days.   Paresthesia of both lower extremities 11/30/2013   Duration 6 years. Intermittent. Persistent in last 14 days. Reviewed Neuro notes 12/23/13: Dr Vallarie Mare [facility]: small fiber neuropathy is likely, large fiber not r/o, amyloidosis is possiblity. Labs & NCV/EMG tests pending   Radiculopathy of cervical spine    Urine incontinence    Allergies  Allergen Reactions   Goat Milk Hives   Goat-Derived Products Hives   Chlorhexidine Itching and Rash   Past Surgical History:  Procedure Laterality Date   APPENDECTOMY  2012   CERVICAL SPINE SURGERY  07/18/2020   kidney stone removal  2011   TONSILLECTOMY  1996   WISDOM TOOTH EXTRACTION     Family History  Problem Relation Age of Onset   Dystonia Mother        cervical dystonia from trauma   Kidney disease Mother        stones   Breast cancer Mother    Arthritis Mother    Hypertension Father  Hyperlipidemia Father    Other Father        amyloidosis   Hyperlipidemia Sister    Kidney disease Sister        stones   Other Sister        hearing loss, etio?   ALS Paternal Uncle    Multiple sclerosis Paternal Uncle    Diabetes Paternal Uncle    Kidney cancer Maternal Grandfather    Heart disease Paternal Grandmother    Heart disease Paternal Grandfather    Colon cancer Maternal Grandmother     Esophageal cancer Neg Hx    Rectal cancer Neg Hx    Stomach cancer Neg Hx    Social History   Social History Narrative   Kristina Pierce lives with her daughter [name]). She is a widow.    She works Medical laboratory scientific officer at [employer], Research scientist (medical).   Masters degree.   Drinks caffeine, takes a daily vitamin.   Wear seatbelt, wears a bicycle helmet, smoke detector at home.   Feels safe in her relationships.    Allergies as of 07/09/2021       Reactions   Goat Milk Hives   Goat-derived Products Hives   Chlorhexidine Itching, Rash        Medication List        Accurate as of July 09, 2021 10:56 AM. If you have any questions, ask your nurse or doctor.          STOP taking these medications    gabapentin 300 MG capsule Commonly known as: NEURONTIN Stopped by: Howard Pouch, DO   HYDROcodone-acetaminophen 5-325 MG tablet Commonly known as: NORCO/VICODIN Stopped by: Howard Pouch, DO   oseltamivir 75 MG capsule Commonly known as: Tamiflu Stopped by: Howard Pouch, DO       TAKE these medications    famotidine 20 MG tablet Commonly known as: PEPCID Take 1 tablet (20 mg total) by mouth 2 (two) times daily.   Junel FE 1/20 1-20 MG-MCG tablet Generic drug: norethindrone-ethinyl estradiol-FE Take 1 tablet by mouth daily.   multivitamin tablet Take 1 tablet by mouth daily.   mupirocin ointment 2 % Commonly known as: BACTROBAN Apply 1 application topically 2 (two) times daily.        All past medical history, surgical history, allergies, family history, immunizations andmedications were updated in the EMR today and reviewed under the history and medication portions of their EMR.     No results found for this or any previous visit (from the past 2160 hour(s)).  No results found.   ROS: 14 pt review of systems performed and negative (unless mentioned in an HPI)  Objective: BP 119/81   Pulse 66   Temp 98.2 F (36.8 C) (Oral)   Ht 5' 5.5" (1.664 m)   Wt 168 lb  (76.2 kg)   SpO2 98%   BMI 27.53 kg/m  Gen: Afebrile. No acute distress. Nontoxic in appearance, well-developed, well-nourished,  pleasant female.  HENT: AT. [place]. Bilateral TM visualized and normal in appearance, normal external auditory canal. MMM, no oral lesions, adequate dentition. Bilateral nares within normal limits. Throat without erythema, ulcerations or exudates. no Cough on exam, no hoarseness on exam. Eyes:Pupils Equal Round Reactive to light, Extraocular movements intact,  Conjunctiva without redness, discharge or icterus. Neck/lymp/endocrine: Supple,no lymphadenopathy, no thyromegaly CV: RRR no murmur, no edema, +2/4 P posterior tibialis pulses.  Chest: CTAB, no wheeze, rhonchi or crackles. normal Respiratory effort. good Air movement. Abd: Soft. flat. NTND. BS present. no  Masses palpated. No hepatosplenomegaly. No rebound tenderness or guarding. Skin: no rashes, purpura or petechiae. Warm and well-perfused. Skin intact. Neuro/Msk: Normal gait. PERLA. EOMi. Alert. Oriented x3.  Cranial nerves II through XII intact. Muscle strength 5/5 upper/lower extremity. DTRs equal bilaterally. Psych: Normal affect, dress and demeanor. Normal speech. Normal thought content and judgment.   No results found.  Assessment/plan: REMMI ARMENTEROS is a 46 y.o. female present for CPE  Encounter for hepatitis C screening test for low risk patient - Hepatitis C antibody Diabetes mellitus screening - Hemoglobin A1c Long term (current) use of hormonal contraceptives - CBC - Comprehensive metabolic panel - Lipid panel Managed by gyn Routine general medical examination at a health care facility Colonoscopy: Fhx colon cancer MGM (69). Completed 01/2020- 3 yr. Dr. Tarri Glenn Mammogram: FHX breast cancer in mother (64).  completed:2022- gyn Cervical cancer screening: last pap: 08/2017, completed by: Dr. Matthew Saras Immunizations: tdap 2018 UTD, Influenza UTD. Covid UTD Infectious disease screening: HIV  completed 2015. Hep C agreeable.  DEXA: routine screen at 60 Patient was encouraged to exercise greater than 150 minutes a week. Patient was encouraged to choose a diet filled with fresh fruits and vegetables, and lean meats. AVS provided to patient today for education/recommendation on gender specific health and safety maintenance.  Return in about 1 year (around 07/10/2022) for CPE (30 min).   Orders Placed This Encounter  Procedures   CBC   Comprehensive metabolic panel   Hemoglobin A1c   Lipid panel   Hepatitis C antibody    No orders of the defined types were placed in this encounter.  Referral Orders  No referral(s) requested today     Electronically signed by: Howard Pouch, [place]

## 2021-07-09 NOTE — Patient Instructions (Signed)
°Great to see you today.  °I have refilled the medication(s) we provide.  ° °If labs were collected, we will inform you of lab results once received either by echart message or telephone call.  ° - echart message- for normal results that have been seen by the patient already.  ° - telephone call: abnormal results or if patient has not viewed results in their echart. ° °Health Maintenance, Female °Adopting a healthy lifestyle and getting preventive care are important in promoting health and wellness. Ask your health care provider about: °The right schedule for you to have regular tests and exams. °Things you can do on your own to prevent diseases and keep yourself healthy. °What should I know about diet, weight, and exercise? °Eat a healthy diet ° °Eat a diet that includes plenty of vegetables, fruits, low-fat dairy products, and lean protein. °Do not eat a lot of foods that are high in solid fats, added sugars, or sodium. °Maintain a healthy weight °Body mass index (BMI) is used to identify weight problems. It estimates body fat based on height and weight. Your health care provider can help determine your BMI and help you achieve or maintain a healthy weight. °Get regular exercise °Get regular exercise. This is one of the most important things you can do for your health. Most adults should: °Exercise for at least 150 minutes each week. The exercise should increase your heart rate and make you sweat (moderate-intensity exercise). °Do strengthening exercises at least twice a week. This is in addition to the moderate-intensity exercise. °Spend less time sitting. Even light physical activity can be beneficial. °Watch cholesterol and blood lipids °Have your blood tested for lipids and cholesterol at 45 years of age, then have this test every 5 years. °Have your cholesterol levels checked more often if: °Your lipid or cholesterol levels are high. °You are older than 45 years of age. °You are at high risk for heart  disease. °What should I know about cancer screening? °Depending on your health history and family history, you may need to have cancer screening at various ages. This may include screening for: °Breast cancer. °Cervical cancer. °Colorectal cancer. °Skin cancer. °Lung cancer. °What should I know about heart disease, diabetes, and high blood pressure? °Blood pressure and heart disease °High blood pressure causes heart disease and increases the risk of stroke. This is more likely to develop in people who have high blood pressure readings or are overweight. °Have your blood pressure checked: °Every 3-5 years if you are 18-39 years of age. °Every year if you are 40 years old or older. °Diabetes °Have regular diabetes screenings. This checks your fasting blood sugar level. Have the screening done: °Once every three years after age 40 if you are at a normal weight and have a low risk for diabetes. °More often and at a younger age if you are overweight or have a high risk for diabetes. °What should I know about preventing infection? °Hepatitis B °If you have a higher risk for hepatitis B, you should be screened for this virus. Talk with your health care provider to find out if you are at risk for hepatitis B infection. °Hepatitis C °Testing is recommended for: °Everyone born from 1945 through 1965. °Anyone with known risk factors for hepatitis C. °Sexually transmitted infections (STIs) °Get screened for STIs, including gonorrhea and chlamydia, if: °You are sexually active and are younger than 45 years of age. °You are older than 45 years of age and your health care provider   tells you that you are at risk for this type of infection. °Your sexual activity has changed since you were last screened, and you are at increased risk for chlamydia or gonorrhea. Ask your health care provider if you are at risk. °Ask your health care provider about whether you are at high risk for HIV. Your health care provider may recommend a  prescription medicine to help prevent HIV infection. If you choose to take medicine to prevent HIV, you should first get tested for HIV. You should then be tested every 3 months for as long as you are taking the medicine. °Pregnancy °If you are about to stop having your period (premenopausal) and you may become pregnant, seek counseling before you get pregnant. °Take 400 to 800 micrograms (mcg) of folic acid every day if you become pregnant. °Ask for birth control (contraception) if you want to prevent pregnancy. °Osteoporosis and menopause °Osteoporosis is a disease in which the bones lose minerals and strength with aging. This can result in bone fractures. If you are 65 years old or older, or if you are at risk for osteoporosis and fractures, ask your health care provider if you should: °Be screened for bone loss. °Take a calcium or vitamin D supplement to lower your risk of fractures. °Be given hormone replacement therapy (HRT) to treat symptoms of menopause. °Follow these instructions at home: °Alcohol use °Do not drink alcohol if: °Your health care provider tells you not to drink. °You are pregnant, may be pregnant, or are planning to become pregnant. °If you drink alcohol: °Limit how much you have to: °0-1 drink a day. °Know how much alcohol is in your drink. In the [place] one drink equals one 12 oz bottle of beer (355 mL), one 5 oz glass of wine (148 mL), or one 1½ oz glass of hard liquor (44 mL). °Lifestyle °Do not use any products that contain nicotine or tobacco. These products include cigarettes, chewing tobacco, and vaping devices, such as e-cigarettes. If you need help quitting, ask your health care provider. °Do not use street drugs. °Do not share needles. °Ask your health care provider for help if you need support or information about quitting drugs. °General instructions °Schedule regular health, dental, and eye exams. °Stay current with your vaccines. °Tell your health care provider if: °You often  feel depressed. °You have ever been abused or do not feel safe at home. °Summary °Adopting a healthy lifestyle and getting preventive care are important in promoting health and wellness. °Follow your health care provider's instructions about healthy diet, exercising, and getting tested or screened for diseases. °Follow your health care provider's instructions on monitoring your cholesterol and blood pressure. °This information is not intended to replace advice given to you by your health care provider. Make sure you discuss any questions you have with your health care provider. °Document Revised: 12/25/2020 Document Reviewed: 12/25/2020 °Elsevier Patient Education © 2022 [employer] ° °

## 2021-07-10 LAB — HEPATITIS C ANTIBODY
Hepatitis C Ab: NONREACTIVE
SIGNAL TO CUT-OFF: <0.02 (ref <1.00)

## 2021-11-05 ENCOUNTER — Ambulatory Visit: Payer: BC Managed Care – PPO | Admitting: Family Medicine

## 2021-11-05 ENCOUNTER — Encounter: Payer: Self-pay | Admitting: Family Medicine

## 2021-11-05 VITALS — BP 130/80 | HR 88 | Temp 98.0°F | Ht 65.5 in | Wt 174.0 lb

## 2021-11-05 DIAGNOSIS — R051 Acute cough: Secondary | ICD-10-CM

## 2021-11-05 DIAGNOSIS — J209 Acute bronchitis, unspecified: Secondary | ICD-10-CM

## 2021-11-05 MED ORDER — ALBUTEROL SULFATE HFA 108 (90 BASE) MCG/ACT IN AERS: 2.0000 | INHALATION_SPRAY | Freq: Four times a day (QID) | RESPIRATORY_TRACT | 0 refills | Status: DC | PRN

## 2021-11-05 MED ORDER — DOXYCYCLINE HYCLATE 100 MG PO TABS: 100.0000 mg | ORAL_TABLET | Freq: Two times a day (BID) | ORAL | 0 refills | Status: DC

## 2021-11-05 MED ORDER — ALBUTEROL SULFATE (2.5 MG/3ML) 0.083% IN NEBU
2.5000 mg | INHALATION_SOLUTION | Freq: Once | RESPIRATORY_TRACT | Status: AC
  Administered 2021-11-05: 2.5 mg via RESPIRATORY_TRACT

## 2021-11-05 MED ORDER — PREDNISONE 50 MG PO TABS: 50.0000 mg | ORAL_TABLET | Freq: Every day | ORAL | 0 refills | Status: DC

## 2021-11-05 NOTE — Progress Notes (Signed)
? ? ? ?This visit occurred during the SARS-CoV-2 public health emergency.  Safety protocols were in place, including screening questions prior to the visit, additional usage of staff PPE, and extensive cleaning of exam room while observing appropriate contact time as indicated for disinfecting solutions.  ? ? ?Kristina Pierce , 10/23/1975, 46 y.o., female ?MRN: [id number] ?Patient Care Team  ?  Relationship Specialty Notifications Start End  ?[name], DO PCP - General Family Medicine  12/02/19   ?[name], MD Consulting Physician Urology  08/14/16   ?[name], MD Consulting Physician Obstetrics and Gynecology  04/30/18   ?[name], MD Consulting Physician Gastroenterology  07/09/21   ? ? ?Chief Complaint  ?Patient presents with  ? Cough  ?  Pt c/o productive cough, congestion, wheezing since 10/19/21; pt was swabbed for strep at UC strep but was neg and was given abx  ? ?  ?Subjective: Pt presents for an OV with complaints of cough and congestion of 3 weeks duration.  Associated symptoms include wheezing. ?Was seen in UC at that time and provided w/ abx. Strep test was negative.  ?Since that time patient reports she has continued to have fatigue, wheezing and rattling in her chest.  ? ?Depression screen [facility] 2/9 07/09/2021 04/30/2018 08/14/2016  ?Decreased Interest 0 0 0  ?Down, Depressed, Hopeless 0 0 0  ?PHQ - 2 Score 0 0 0  ? ? ?Allergies  ?Allergen Reactions  ? Goat Milk Hives  ? Goat-Derived Products Hives  ? Chlorhexidine Itching and Rash  ? ?Social History  ? ?Social History Narrative  ? Kristina Pierce lives with her daughter [name]). She is a widow.   ? She works Medical laboratory scientific officer at [employer], Research scientist (medical).  ? Masters degree.  ? Drinks caffeine, takes a daily vitamin.  ? Wear seatbelt, wears a bicycle helmet, smoke detector at home.  ? Feels safe in her relationships.  ? ?Past Medical History:  ?Diagnosis Date  ? Allergy   ? Chicken pox   ? Elevated blood pressure reading without  diagnosis of hypertension   ? Kidney stones   ? Migraine   ? 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 Eval by Dr. Berenice Primas. Tried systemic prednisone-unable to tolerate SE-HA, dizzy. Discussed steroid injection.  ? Neck pain 07/18/2020  ? Neuropathy   ? Pain in joint of right shoulder 07/18/2020  ? Paresthesia of both hands 11/30/2013  ? Duration few years, intermittent. More intense last 14 days.  ? Paresthesia of both lower extremities 11/30/2013  ? Duration 6 years. Intermittent. Persistent in last 14 days. Reviewed Neuro notes 12/23/13: Dr Vallarie Mare [facility]: small fiber neuropathy is likely, large fiber not r/o, amyloidosis is possiblity. Labs & NCV/EMG tests pending  ? Radiculopathy of cervical spine   ? Urine incontinence   ? ?Past Surgical History:  ?Procedure Laterality Date  ? APPENDECTOMY  2012  ? CERVICAL SPINE SURGERY  07/18/2020  ? kidney stone removal  2011  ? TONSILLECTOMY  1996  ? WISDOM TOOTH EXTRACTION    ? ?Family History  ?Problem Relation Age of Onset  ? Dystonia Mother   ?     cervical dystonia from trauma  ? Kidney disease Mother   ?     stones  ? Breast cancer Mother   ? Arthritis Mother   ? Hypertension Father   ? Hyperlipidemia Father   ? Other Father   ?     amyloidosis  ? Hyperlipidemia Sister   ? Kidney  disease Sister   ?     stones  ? Other Sister   ?     hearing loss, etio?  ? ALS Paternal Uncle   ? Multiple sclerosis Paternal Uncle   ? Diabetes Paternal Uncle   ? Kidney cancer Maternal Grandfather   ? Heart disease Paternal Grandmother   ? Heart disease Paternal Grandfather   ? Colon cancer Maternal Grandmother   ? Esophageal cancer Neg Hx   ? Rectal cancer Neg Hx   ? Stomach cancer Neg Hx   ? ?Allergies as of 11/05/2021   ? ?   Reactions  ? Goat Milk Hives  ? Goat-derived Products Hives  ? Chlorhexidine Itching, Rash  ? ?  ? ?  ?Medication List  ?  ? ?  ? Accurate as of November 05, 2021 10:41 AM. If you have any questions, ask your nurse or doctor.  ?  ?  ? ?  ? ?STOP taking these medications    ? ?amoxicillin 500 MG capsule ?Commonly known as: AMOXIL ?Stopped by: Howard Pouch, DO ?  ? ?  ? ?TAKE these medications   ? ?doxycycline 100 MG tablet ?Commonly known as: VIBRA-TABS ?Take 1 tablet (100 mg total) by mouth 2 (two) times daily. ?Started by: Howard Pouch, DO ?  ?famotidine 20 MG tablet ?Commonly known as: PEPCID ?Take 1 tablet (20 mg total) by mouth 2 (two) times daily. ?  ?Junel FE 1/20 1-20 MG-MCG tablet ?Generic drug: norethindrone-ethinyl estradiol-FE ?Take 1 tablet by mouth daily. ?  ?multivitamin tablet ?Take 1 tablet by mouth daily. ?  ?mupirocin ointment 2 % ?Commonly known as: BACTROBAN ?Apply 1 application topically 2 (two) times daily. ?  ?predniSONE 50 MG tablet ?Commonly known as: DELTASONE ?Take 1 tablet (50 mg total) by mouth daily with breakfast. ?Started by: Howard Pouch, DO ?  ? ?  ? ? ?All past medical history, surgical history, allergies, family history, immunizations andmedications were updated in the EMR today and reviewed under the history and medication portions of their EMR.    ? ?ROS ?Negative, with the exception of above mentioned in HPI ? ? ?Objective:  ?BP 130/80   Pulse 88   Temp 98 ?F (36.7 ?C) (Oral)   Ht 5' 5.5" (1.664 m)   Wt 174 lb (78.9 kg)   SpO2 97%   BMI 28.51 kg/m?  ?Body mass index is 28.51 kg/m?Marland Kitchen ?Physical Exam ?Vitals and nursing note reviewed.  ?Constitutional:   ?   General: She is not in acute distress. ?   Appearance: Normal appearance. She is normal weight. She is not ill-appearing or toxic-appearing.  ?HENT:  ?   Head: Normocephalic and atraumatic.  ?   Mouth/Throat:  ?   Pharynx: No oropharyngeal exudate or posterior oropharyngeal erythema.  ?Eyes:  ?   General:     ?   Right eye: No discharge.     ?   Left eye: No discharge.  ?   Extraocular Movements: Extraocular movements intact.  ?   Conjunctiva/sclera: Conjunctivae normal.  ?   Pupils: Pupils are equal, round, and reactive to light.  ?Cardiovascular:  ?   Rate and Rhythm: Normal rate and  regular rhythm.  ?   Heart sounds: No murmur heard. ?Pulmonary:  ?   Effort: Pulmonary effort is normal. No respiratory distress.  ?   Breath sounds: Wheezing and rhonchi present.  ?Musculoskeletal:  ?   Right lower leg: No edema.  ?   Left lower leg: No  edema.  ?Neurological:  ?   Mental Status: She is alert and oriented to person, place, and time. Mental status is at baseline.  ?Psychiatric:     ?   Mood and Affect: Mood normal.     ?   Behavior: Behavior normal.     ?   Thought Content: Thought content normal.     ?   Judgment: Judgment normal.  ? ? ? ?No results found. ?No results found. ?No results found for this or any previous visit (from the past 24 hour(s)). ? ?Assessment/Plan: ?OVETA IDRIS is a 46 y.o. female present for OV for  ?Acute bronchitis with symptoms > 10 days/Acute cough ?Rest, hydrate.  ?+/- flonase, mucinex (DM if cough), nettie pot or nasal saline.  ?Doxy and pred prescribed, take until completed.  ?Albuterol tapering scheduled over the next few days.  ?If cough present it can last up to 6-8 weeks.  ?F/U 2 weeks of not improved.  ? ? ?Reviewed expectations re: course of current medical issues. ?Discussed self-management of symptoms. ?Outlined signs and symptoms indicating need for more acute intervention. ?Patient verbalized understanding and all questions were answered. ?Patient received an After-Visit Summary. ? ? ? ?No orders of the defined types were placed in this encounter. ? ?Meds ordered this encounter  ?Medications  ? predniSONE (DELTASONE) 50 MG tablet  ?  Sig: Take 1 tablet (50 mg total) by mouth daily with breakfast.  ?  Dispense:  5 tablet  ?  Refill:  0  ? doxycycline (VIBRA-TABS) 100 MG tablet  ?  Sig: Take 1 tablet (100 mg total) by mouth 2 (two) times daily.  ?  Dispense:  20 tablet  ?  Refill:  0  ? ?Referral Orders  ?No referral(s) requested today  ? ? ? ?Note is dictated utilizing voice recognition software. Although note has been proof read prior to signing,  occasional typographical errors still can be missed. If any questions arise, please do not hesitate to call for verification.  ? ?electronically signed by: ? ?Howard Pouch, DO  ?[facility] [place] ? ? ? ?

## 2021-11-05 NOTE — Patient Instructions (Signed)
Acute Bronchitis, Adult ?Acute bronchitis is when air tubes in the lungs (bronchi) suddenly get swollen. The condition can make it hard for you to breathe. In adults, acute bronchitis usually goes away within 2 weeks. A cough caused by bronchitis may last up to 3 weeks. Smoking, allergies, and asthma can make the condition worse. ?What are the causes? ?Germs that cause cold and flu (viruses). The most common cause of this condition is the virus that causes the common cold. ?Bacteria. ?Substances that bother (irritate) the lungs, including: ?Smoke from cigarettes and other types of tobacco. ?Dust and pollen. ?Fumes from chemicals, gases, or burned fuel. ?Indoor or outdoor air pollution. ?What increases the risk? ?A weak body's defense system. This is also called the immune system. ?Any condition that affects your lungs and breathing, such as asthma. ?What are the signs or symptoms? ?A cough. ?Coughing up clear, yellow, or green mucus. ?Making high-pitched whistling sounds when you breathe, most often when you breathe out (wheezing). ?Runny or stuffy nose. ?Having too much mucus in your lungs (chest congestion). ?Shortness of breath. ?Body aches. ?A sore throat. ?How is this treated? ?Acute bronchitis may go away over time without treatment. Your doctor may tell you to: ?Drink more fluids. This will help thin your mucus so it is easier to cough up. ?Use a device that gets medicine into your lungs (inhaler). ?Use a vaporizer or a humidifier. These are machines that add water to the air. This helps with coughing and poor breathing. ?Take a medicine that thins mucus and helps clear it from your lungs. ?Take a medicine that prevents or stops coughing. ?It is not common to take an antibiotic medicine for this condition. ?Follow these instructions at home: ? ?Take over-the-counter and prescription medicines only as told by your doctor. ?Use an inhaler, vaporizer, or humidifier as told by your doctor. ?Take two teaspoons (10  mL) of honey at bedtime. This helps lessen your coughing at night. ?Drink enough fluid to keep your pee (urine) pale yellow. ?Do not smoke or use any products that contain nicotine or tobacco. If you need help quitting, ask your doctor. ?Get a lot of rest. ?Return to your normal activities when your doctor says that it is safe. ?Keep all follow-up visits. ?How is this prevented? ? ?Wash your hands often with soap and water for at least 20 seconds. If you cannot use soap and water, use hand sanitizer. ?Avoid contact with people who have cold symptoms. ?Try not to touch your mouth, nose, or eyes with your hands. ?Avoid breathing in smoke or chemical fumes. ?Make sure to get the flu shot every year. ?Contact a doctor if: ?Your symptoms do not get better in 2 weeks. ?You have trouble coughing up the mucus. ?Your cough keeps you awake at night. ?You have a fever. ?Get help right away if: ?You cough up blood. ?You have chest pain. ?You have very bad shortness of breath. ?You faint or keep feeling like you are going to faint. ?You have a very bad headache. ?Your fever or chills get worse. ?These symptoms may be an emergency. Get help right away. Call your local emergency services (911 in the [place] ?Do not wait to see if the symptoms will go away. ?Do not drive yourself to the hospital. ?Summary ?Acute bronchitis is when air tubes in the lungs (bronchi) suddenly get swollen. In adults, acute bronchitis usually goes away within 2 weeks. ?Drink more fluids. This will help thin your mucus so it is easier to   cough up. ?Take over-the-counter and prescription medicines only as told by your doctor. ?Contact a doctor if your symptoms do not improve after 2 weeks of treatment. ?This information is not intended to replace advice given to you by your health care provider. Make sure you discuss any questions you have with your health care provider. ?Document Revised: 12/06/2020 Document Reviewed: 12/06/2020 ?Elsevier Patient Education  ? 2022 Elsevier Inc. ? ?

## 2022-02-17 ENCOUNTER — Encounter (HOSPITAL_BASED_OUTPATIENT_CLINIC_OR_DEPARTMENT_OTHER): Payer: Self-pay | Admitting: Urology

## 2022-02-17 ENCOUNTER — Emergency Department (HOSPITAL_BASED_OUTPATIENT_CLINIC_OR_DEPARTMENT_OTHER)
Admission: EM | Admit: 2022-02-17 | Discharge: 2022-02-17 | Disposition: A | Discharge status: Home / Self Care | Payer: BC Managed Care – PPO | Attending: Emergency Medicine | Admitting: Emergency Medicine

## 2022-02-17 DIAGNOSIS — R202 Paresthesia of skin: Secondary | ICD-10-CM | POA: Diagnosis not present

## 2022-02-17 DIAGNOSIS — G44019 Episodic cluster headache, not intractable: Secondary | ICD-10-CM | POA: Diagnosis not present

## 2022-02-17 DIAGNOSIS — R519 Headache, unspecified: Secondary | ICD-10-CM | POA: Diagnosis present

## 2022-02-17 DIAGNOSIS — R030 Elevated blood-pressure reading, without diagnosis of hypertension: Secondary | ICD-10-CM | POA: Insufficient documentation

## 2022-02-17 LAB — BASIC METABOLIC PANEL
Anion gap: 7 (ref 5–15)
BUN: 13 mg/dL (ref 6–20)
CO2: 24 mmol/L (ref 22–32)
Calcium: 8.6 mg/dL — ABNORMAL LOW (ref 8.9–10.3)
Chloride: 110 mmol/L (ref 98–111)
Creatinine, Ser: 0.71 mg/dL (ref 0.44–1.00)
GFR, Estimated: >60 mL/min (ref >60)
Glucose, Bld: 113 mg/dL — ABNORMAL HIGH (ref 70–99)
Potassium: 3.4 mmol/L — ABNORMAL LOW (ref 3.5–5.1)
Sodium: 141 mmol/L (ref 135–145)

## 2022-02-17 LAB — CBC WITH DIFFERENTIAL/PLATELET
Abs Immature Granulocytes: 0.01 10*3/uL (ref 0.00–0.07)
Basophils Absolute: 0 10*3/uL (ref 0.0–0.1)
Basophils Relative: 1 %
Eosinophils Absolute: 0.1 10*3/uL (ref 0.0–0.5)
Eosinophils Relative: 2 %
HCT: 40.2 % (ref 36.0–46.0)
Hemoglobin: 14 g/dL (ref 12.0–15.0)
Immature Granulocytes: 0 %
Lymphocytes Relative: 28 %
Lymphs Abs: 1.8 10*3/uL (ref 0.7–4.0)
MCH: 32.3 pg (ref 26.0–34.0)
MCHC: 34.8 g/dL (ref 30.0–36.0)
MCV: 92.8 fL (ref 80.0–100.0)
Monocytes Absolute: 0.3 10*3/uL (ref 0.1–1.0)
Monocytes Relative: 4 %
Neutro Abs: 4.3 10*3/uL (ref 1.7–7.7)
Neutrophils Relative %: 65 %
Platelets: 262 10*3/uL (ref 150–400)
RBC: 4.33 MIL/uL (ref 3.87–5.11)
RDW: 12.5 % (ref 11.5–15.5)
WBC: 6.5 10*3/uL (ref 4.0–10.5)
nRBC: 0 % (ref 0.0–0.2)

## 2022-02-17 MED ORDER — POTASSIUM CHLORIDE 20 MEQ PO PACK
40.0000 meq | PACK | Freq: Every day | ORAL | Status: DC
  Administered 2022-02-17: 40 meq via ORAL
  Filled 2022-02-17: qty 2

## 2022-02-17 NOTE — ED Provider Notes (Signed)
[facility] Provider Note: Kristina Spurling, MD, FACEP  CSN: [id number] MRN: [id number] ARRIVAL: 02/17/22 at [place]: [place]  Headache   HISTORY OF PRESENT ILLNESS  02/17/22 5:35 AM Kristina Pierce is a 46 y.o. female who has been having episodic headaches since June 16.  She has had 4 episodes, most recently 2 AM today.  These headaches, randomly and are present in the right orbital and periorbital region of her face.  She describes them as sharp and stabbing.  She rates the pain as a 4 out of 10 but states she has a high pain tolerance.  She took Excedrin this morning and the headache resolved after about an hour which is typical for these headaches.  She had no blurred vision with this nor did she have any right eye redness or watering.  Over the same period of time she has noticed intermittent paresthesias of her fingers and toes which do not correlate with her headaches.  She has no history of cluster headaches.  She does have a history of migraines which are different than this.  She became concerned because she checked her blood pressure at home and found it to be 154/100.  On arrival here her blood pressure was 137/83.  She is not having a headache presently.   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 Berenice Primas. Tried systemic prednisone-unable to tolerate SE-HA, dizzy. Discussed steroid injection.   Neck pain 07/18/2020   Neuropathy    Pain in joint of right shoulder 07/18/2020   Paresthesia of both hands 11/30/2013   Duration few years, intermittent. More intense last 14 days.   Paresthesia of both lower extremities 11/30/2013   Duration 6 years. Intermittent. Persistent in last 14 days. Reviewed Neuro notes 12/23/13: Dr Vallarie Mare [facility]: small fiber neuropathy is likely,  large fiber not r/o, amyloidosis is possiblity. Labs & NCV/EMG tests pending   Radiculopathy of cervical spine    Urine incontinence     Past Surgical History:  Procedure Laterality Date   APPENDECTOMY  2012   CERVICAL SPINE SURGERY  07/18/2020   kidney stone removal  2011   TONSILLECTOMY  1996   WISDOM TOOTH EXTRACTION      Family History  Problem Relation Age of Onset   Dystonia Mother        cervical dystonia from trauma   Kidney disease Mother        stones   Breast cancer Mother    Arthritis Mother    Hypertension Father    Hyperlipidemia Father    Other Father        amyloidosis   Hyperlipidemia Sister    Kidney disease Sister        stones   Other Sister        hearing loss, etio?   ALS Paternal Uncle    Multiple sclerosis Paternal Uncle    Diabetes Paternal Uncle    Kidney cancer Maternal Grandfather    Heart disease Paternal Grandmother    Heart disease Paternal Grandfather    Colon cancer Maternal Grandmother    Esophageal cancer Neg Hx    Rectal cancer Neg Hx    Stomach cancer Neg Hx     Social History  Tobacco Use   Smoking status: Never   Smokeless tobacco: Never  Vaping Use   Vaping Use: Never used  Substance Use Topics   Alcohol use: No   Drug use: No    Prior to Admission medications   Medication Sig Start Date End Date Taking? Authorizing Provider  albuterol (VENTOLIN HFA) 108 (90 Base) MCG/ACT inhaler Inhale 2 puffs into the lungs every 6 (six) hours as needed for wheezing or shortness of breath. 11/05/21   Kuneff, Renee A, DO  famotidine (PEPCID) 20 MG tablet Take 1 tablet (20 mg total) by mouth 2 (two) times daily. 12/02/19   [name], MD  JUNEL FE 1/20 1-20 MG-MCG tablet Take 1 tablet by mouth daily. 09/19/19   [provider]  Multiple Vitamin (MULTIVITAMIN) tablet Take 1 tablet by mouth daily.    [provider]    Allergies Goat milk, Goat-derived products, and Chlorhexidine   REVIEW OF SYSTEMS   Negative except as noted here or in the History of Present Illness.   PHYSICAL EXAMINATION  Initial Vital Signs Blood pressure 137/83, pulse 78, temperature 98.8 F (37.1 C), temperature source Oral, resp. rate 18, height '5\' 5"'$  (1.651 m), weight 78.9 kg, SpO2 97 %.  Examination General: Well-developed, well-nourished female in no acute distress; appearance consistent with age of record HENT: normocephalic; atraumatic Eyes: pupils equal, round and reactive to light; extraocular muscles intact Neck: supple Heart: regular rate and rhythm Lungs: clear to auscultation bilaterally Abdomen: soft; nondistended; nontender; bowel sounds present Extremities: No deformity; full range of motion; pulses normal Neurologic: Awake, alert and oriented; motor function intact in all extremities and symmetric; no facial droop; normal coordination, speech and gait; normal finger-to-nose; no pronator drift Skin: Warm and dry Psychiatric: Normal mood and affect   RESULTS  Summary of this visit's results, reviewed and interpreted by myself:   EKG Interpretation  Date/Time:    Ventricular Rate:    PR Interval:    QRS Duration:   QT Interval:    QTC Calculation:   R Axis:     Text Interpretation:         Laboratory Studies: Results for orders placed or performed during the hospital encounter of 02/17/22 (from the past 24 hour(s))  CBC with Differential     Status: None   Collection Time: 02/17/22  5:34 AM  Result Value Ref Range   WBC 6.5 4.0 - 10.5 K/uL   RBC 4.33 3.87 - 5.11 MIL/uL   Hemoglobin 14.0 12.0 - 15.0 g/dL   HCT 40.2 36.0 - 46.0 %   MCV 92.8 80.0 - 100.0 fL   MCH 32.3 26.0 - 34.0 pg   MCHC 34.8 30.0 - 36.0 g/dL   RDW 12.5 11.5 - 15.5 %   Platelets 262 150 - 400 K/uL   nRBC 0.0 0.0 - 0.2 %   Neutrophils Relative % 65 %   Neutro Abs 4.3 1.7 - 7.7 K/uL   Lymphocytes Relative 28 %   Lymphs Abs 1.8 0.7 - 4.0 K/uL   Monocytes Relative 4 %   Monocytes Absolute 0.3 0.1 - 1.0  K/uL   Eosinophils Relative 2 %   Eosinophils Absolute 0.1 0.0 - 0.5 K/uL   Basophils Relative 1 %   Basophils Absolute 0.0 0.0 - 0.1 K/uL   Immature Granulocytes 0 %   Abs Immature Granulocytes 0.01 0.00 - 0.07 K/uL  Basic metabolic panel     Status: Abnormal   Collection Time: 02/17/22  5:34 AM  Result Value Ref Range   Sodium 141 135 - 145 mmol/L   Potassium 3.4 (L) 3.5 - 5.1 mmol/L   Chloride 110 98 - 111 mmol/L   CO2 24 22 - 32 mmol/L   Glucose, Bld 113 (H) 70 - 99 mg/dL   BUN 13 6 - 20 mg/dL   Creatinine, Ser 0.71 0.44 - 1.00 mg/dL   Calcium 8.6 (L) 8.9 - 10.3 mg/dL   GFR, Estimated >60 >60 mL/min   Anion gap 7 5 - 15   Imaging Studies: No results found.  ED COURSE and MDM  Nursing notes, initial and subsequent vitals signs, including pulse oximetry, reviewed and interpreted by myself.  Vitals:   02/17/22 0529 02/17/22 0531  BP:  137/83  Pulse:  78  Resp:  18  Temp:  98.8 F (37.1 C)  TempSrc:  Oral  SpO2:  97%  Weight: 78.9 kg   Height: '5\' 5"'$  (1.651 m)    Medications  potassium chloride (KLOR-CON) packet 40 mEq (has no administration in time range)   The patient's headaches are most consistent with cluster headaches.  She is not in the usual demographics for cluster headaches, typically males between the ages of 54 and 68, but the location and periodicity of them are consistent.  Hers are not severe and I do not believe she needs treatment with a triptan at this time.  She is in agreement with this, as Excedrin has been adequately treating them.  Her potassium is slightly low and we will give her a dose of potassium supplementation in the ED.  She was also advised that her calcium is low and she should start on over-the-counter calcium supplementation.  She was advised that she should follow-up with her primary care physician promptly and may need a neurology consult regarding her headaches and her paresthesias.   PROCEDURES  Procedures   ED DIAGNOSES      ICD-10-CM   1. Episodic cluster headache, not intractable  G44.019     2. Paresthesias  R20.2     3. Hypocalcemia  E83.51          Gloyd Happ, Jenny Reichmann, MD 02/17/22 [phone]

## 2022-02-17 NOTE — ED Triage Notes (Signed)
Per [facility], pt having headache and hypertension today  Per reports bp 154/100 at home, states headaches intermittent x 1 month with some tingling in hands   Provider at bedside

## 2022-02-20 ENCOUNTER — Ambulatory Visit: Payer: BC Managed Care – PPO | Admitting: Family Medicine

## 2022-02-21 ENCOUNTER — Other Ambulatory Visit (INDEPENDENT_AMBULATORY_CARE_PROVIDER_SITE_OTHER): Payer: BC Managed Care – PPO

## 2022-02-21 ENCOUNTER — Ambulatory Visit (INDEPENDENT_AMBULATORY_CARE_PROVIDER_SITE_OTHER): Payer: BC Managed Care – PPO | Admitting: Family Medicine

## 2022-02-21 ENCOUNTER — Ambulatory Visit: Payer: BC Managed Care – PPO | Admitting: Family Medicine

## 2022-02-21 ENCOUNTER — Encounter: Payer: Self-pay | Admitting: Family Medicine

## 2022-02-21 VITALS — BP 126/86 | HR 79 | Temp 97.8°F | Ht 65.0 in | Wt 171.0 lb

## 2022-02-21 DIAGNOSIS — R202 Paresthesia of skin: Secondary | ICD-10-CM

## 2022-02-21 DIAGNOSIS — R42 Dizziness and giddiness: Secondary | ICD-10-CM

## 2022-02-21 DIAGNOSIS — R519 Headache, unspecified: Secondary | ICD-10-CM

## 2022-02-21 NOTE — Patient Instructions (Signed)
Go to [facility] building in the basement for labs or come back here tomorrow.   [facility] will call you to schedule MRI.   If you have any new or worsening symptoms, go to the ED or call 911.

## 2022-02-21 NOTE — Progress Notes (Signed)
Subjective:     Patient ID: Kristina Pierce, female    DOB: 1976-07-06, 46 y.o.   MRN: [id number]  Chief Complaint  Patient presents with   Dizziness    Martin Majestic to ER early Sunday with headache and dizziness and they wanted her to f/u with doctor but her PCP was sick. Dizziness happens only when she is driving around 27-78 mph, eye doctor has confirmed her eyes are fine. Notes numbness in hands in feet that comes and goes but does not stop her from things she does.     HPI Patient is in today for 4-5 episodes of dizziness. [place] No other dizzy spell until this week. 3 episodes this week lasting only a few seconds to 1 minute and all occurred while driving. Dizziness is non positional.  She was seen in the ED for this. No imaging done.   She also c/o bilateral frontal headaches. C/o a "heaviness in her head".  Denies hx of similar headaches. Worst headache ever.   She was told that she was having cluster headaches.   Denies fever, chills, chest pain, palpitations, shortness of breath, abdominal pain, N/V/D, urinary symptoms, LE edema.    Hx of motion sickness in the backseat of a car.    She has had intermittent nasal congestion and ringing in her ears.   Intermittent numbness and tingling in bilateral hands and feet since May.   Eye doctor exam this morning and normal exam.   Taking a MVI since ED visit   She is a widower and single mom. States she is very concerned about something being wrong with her neurologically and not being able to care for her child.   Reviewed allergies, medications, past medical, surgical, family, and social history.     There are no preventive care reminders to display for this patient.   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 Berenice Primas. Tried systemic prednisone-unable to tolerate SE-HA, dizzy. Discussed steroid injection.   Neck pain 07/18/2020   Neuropathy    Pain in joint of right shoulder 07/18/2020   Paresthesia of both hands 11/30/2013   Duration few years, intermittent. More intense last 14 days.   Paresthesia of both lower extremities 11/30/2013   Duration 6 years. Intermittent. Persistent in last 14 days. Reviewed Neuro notes 12/23/13: Dr Vallarie Mare [facility]: small fiber neuropathy is likely, large fiber not r/o, amyloidosis is possiblity. Labs & NCV/EMG tests pending   Radiculopathy of cervical spine    Urine incontinence     Past Surgical History:  Procedure Laterality Date   APPENDECTOMY  2012   CERVICAL SPINE SURGERY  07/18/2020   kidney stone removal  2011   TONSILLECTOMY  1996   WISDOM TOOTH EXTRACTION      Family History  Problem Relation Age of Onset   Dystonia Mother        cervical dystonia from trauma   Kidney disease Mother        stones   Breast cancer Mother    Arthritis Mother    Hypertension Father    Hyperlipidemia Father    Other Father        amyloidosis   Hyperlipidemia Sister    Kidney  disease Sister        stones   Other Sister        hearing loss, etio?   ALS Paternal Uncle    Multiple sclerosis Paternal Uncle    Diabetes Paternal Uncle    Kidney cancer Maternal Grandfather    Heart disease Paternal Grandmother    Heart disease Paternal Grandfather    Colon cancer Maternal Grandmother    Esophageal cancer Neg Hx    Rectal cancer Neg Hx    Stomach cancer Neg Hx     Social History   Socioeconomic History   Marital status: Widowed    Spouse name: Not on file   Number of children: 2   Years of education: Master   Highest education level: Not on file  Occupational History   Occupation: Product manager: [employer]    Comment: HS chemistry  Tobacco Use   Smoking status: Never   Smokeless tobacco: Never  Vaping Use   Vaping Use: Never  used  Substance and Sexual Activity   Alcohol use: No   Drug use: No   Sexual activity: Yes    Partners: Male    Birth control/protection: Pill    Comment: married  Other Topics Concern   Not on file  Social History Narrative   Kristina Pierce lives with her daughter Kristina Roch). She is a widow.    She works Medical laboratory scientific officer at [employer], Research scientist (medical).   Masters degree.   Drinks caffeine, takes a daily vitamin.   Wear seatbelt, wears a bicycle helmet, smoke detector at home.   Feels safe in her relationships.   Social Determinants of Health   Financial Resource Strain: Not on file  Food Insecurity: Not on file  Transportation Needs: Not on file  Physical Activity: Not on file  Stress: Not on file  Social Connections: Not on file  Intimate Partner Violence: Not on file    Outpatient Medications Prior to Visit  Medication Sig Dispense Refill   albuterol (VENTOLIN HFA) 108 (90 Base) MCG/ACT inhaler Inhale 2 puffs into the lungs every 6 (six) hours as needed for wheezing or shortness of breath. 8 g 0   famotidine (PEPCID) 20 MG tablet Take 1 tablet (20 mg total) by mouth 2 (two) times daily. 60 tablet 2   JUNEL FE 1/20 1-20 MG-MCG tablet Take 1 tablet by mouth daily.     Multiple Vitamin (MULTIVITAMIN) tablet Take 1 tablet by mouth daily.     No facility-administered medications prior to visit.    Allergies  Allergen Reactions   Goat Milk Hives    SOB   Goat-Derived Products Hives   Chlorhexidine Itching and Rash    ROS Pertinent positives and negatives in the history of present illness.     Objective:    Physical Exam Constitutional:      General: She is not in acute distress.    Appearance: She is not ill-appearing.  HENT:     Right Ear: Tympanic membrane and ear canal normal.     Left Ear: Tympanic membrane and ear canal normal.     Mouth/Throat:     Mouth: Mucous membranes are moist.  Eyes:     General: No visual field deficit.    Extraocular Movements: Extraocular  movements intact.     Conjunctiva/sclera: Conjunctivae normal.     Pupils: Pupils are equal, round, and reactive to light.  Cardiovascular:     Rate and Rhythm: Normal rate and regular  rhythm.     Pulses: Normal pulses.     Heart sounds: Normal heart sounds.  Pulmonary:     Effort: Pulmonary effort is normal.     Breath sounds: Normal breath sounds.  Musculoskeletal:        General: Normal range of motion.     Cervical back: Normal range of motion and neck supple. No tenderness.     Right lower leg: No edema.     Left lower leg: No edema.  Lymphadenopathy:     Cervical: No cervical adenopathy.  Skin:    General: Skin is warm and dry.  Neurological:     General: No focal deficit present.     Mental Status: She is alert and oriented to person, place, and time.     Cranial Nerves: No cranial nerve deficit or facial asymmetry.     Sensory: No sensory deficit.     Motor: No weakness, tremor or pronator drift.     Coordination: Romberg sign negative. Coordination normal.     Gait: Gait normal.     Deep Tendon Reflexes: Reflexes normal.  Psychiatric:        Mood and Affect: Mood normal.        Behavior: Behavior normal.        Thought Content: Thought content normal.     BP 126/86 (BP Location: Right Arm, Patient Position: Sitting, Cuff Size: Large)   Pulse 79   Temp 97.8 F (36.6 C) (Temporal)   Ht '5\' 5"'$  (1.651 m)   Wt 171 lb (77.6 kg)   SpO2 97%   BMI 28.46 kg/m  Wt Readings from Last 3 Encounters:  02/21/22 171 lb (77.6 kg)  02/17/22 173 lb 15.1 oz (78.9 kg)  11/05/21 174 lb (78.9 kg)       Assessment & Plan:   Problem List Items Addressed This Visit       Other   Dizziness    Dizziness while driving on 3 separate occasions and resolved quickly after stopping the car and being still.  She is afraid to drive currently.  She has not been dizzy otherwise.  Check labs, MRI brain and refer to neurology.      Relevant Orders   MR Brain Wo Contrast   Comprehensive  metabolic panel (Completed)   Ambulatory referral to Neurology   Paresthesias    Does not appear to be related to dizziness but she does have a history of paresthesias.  Benign neurological exam today.  Check labs including TSH and B12.       Relevant Orders   MR Brain Wo Contrast   Vitamin B12 (Completed)   TSH (Completed)   Comprehensive metabolic panel (Completed)   Ambulatory referral to Neurology   Worst headache of life - Primary    Denies history of similar headaches.  Benign neurological exam today.  No sign of infectious process.  MRI brain ordered.  Neurology referral.      Relevant Orders   MR Brain Wo Contrast   Ambulatory referral to Neurology   Other Visit Diagnoses     New onset headache       Relevant Orders   MR Brain Wo Contrast   Ambulatory referral to Neurology       I am having Kristina Pierce maintain her multivitamin, Junel FE 1/20, famotidine, and albuterol.  No orders of the defined types were placed in this encounter.

## 2022-02-22 DIAGNOSIS — R202 Paresthesia of skin: Secondary | ICD-10-CM | POA: Insufficient documentation

## 2022-02-22 DIAGNOSIS — R519 Headache, unspecified: Secondary | ICD-10-CM | POA: Insufficient documentation

## 2022-02-22 DIAGNOSIS — R42 Dizziness and giddiness: Secondary | ICD-10-CM | POA: Insufficient documentation

## 2022-02-22 LAB — COMPREHENSIVE METABOLIC PANEL
ALT: 11 U/L (ref 0–35)
AST: 13 U/L (ref 0–37)
Albumin: 4.3 g/dL (ref 3.5–5.2)
Alkaline Phosphatase: 43 U/L (ref 39–117)
BUN: 15 mg/dL (ref 6–23)
CO2: 25 mEq/L (ref 19–32)
Calcium: 9.3 mg/dL (ref 8.4–10.5)
Chloride: 105 mEq/L (ref 96–112)
Creatinine, Ser: 0.76 mg/dL (ref 0.40–1.20)
GFR: 94.02 mL/min (ref >60.00)
Glucose, Bld: 87 mg/dL (ref 70–99)
Potassium: 4 mEq/L (ref 3.5–5.1)
Sodium: 141 mEq/L (ref 135–145)
Total Bilirubin: 0.3 mg/dL (ref 0.2–1.2)
Total Protein: 7.6 g/dL (ref 6.0–8.3)

## 2022-02-22 LAB — TSH: TSH: 1.04 u[IU]/mL (ref 0.35–5.50)

## 2022-02-22 LAB — VITAMIN B12: Vitamin B-12: 224 pg/mL (ref 211–911)

## 2022-02-22 NOTE — Assessment & Plan Note (Signed)
Dizziness while driving on 3 separate occasions and resolved quickly after stopping the car and being still.  She is afraid to drive currently.  She has not been dizzy otherwise.  Check labs, MRI brain and refer to neurology.

## 2022-02-22 NOTE — Assessment & Plan Note (Signed)
Does not appear to be related to dizziness but she does have a history of paresthesias.  Benign neurological exam today.  Check labs including TSH and B12.

## 2022-02-22 NOTE — Assessment & Plan Note (Signed)
Denies history of similar headaches.  Benign neurological exam today.  No sign of infectious process.  MRI brain ordered.  Neurology referral.

## 2022-02-25 ENCOUNTER — Telehealth: Payer: BC Managed Care – PPO | Admitting: Physician Assistant

## 2022-02-25 DIAGNOSIS — B9689 Other specified bacterial agents as the cause of diseases classified elsewhere: Secondary | ICD-10-CM

## 2022-02-25 DIAGNOSIS — H66001 Acute suppurative otitis media without spontaneous rupture of ear drum, right ear: Secondary | ICD-10-CM | POA: Diagnosis not present

## 2022-02-25 DIAGNOSIS — J019 Acute sinusitis, unspecified: Secondary | ICD-10-CM

## 2022-02-25 MED ORDER — AMOXICILLIN-POT CLAVULANATE 875-125 MG PO TABS: 1.0000 | ORAL_TABLET | Freq: Two times a day (BID) | ORAL | 0 refills | Status: DC

## 2022-02-25 MED ORDER — FLUTICASONE PROPIONATE 50 MCG/ACT NA SUSP: 2.0000 | Freq: Every day | NASAL | 0 refills | Status: AC

## 2022-02-25 NOTE — Progress Notes (Signed)
Virtual Visit Consent   Kristina Pierce, you are scheduled for a virtual visit with a [facility] provider today. Just as with appointments in the office, your consent must be obtained to participate. Your consent will be active for this visit and any virtual visit you may have with one of our providers in the next 365 days. If you have a MyChart account, a copy of this consent can be sent to you electronically.  As this is a virtual visit, video technology does not allow for your provider to perform a traditional examination. This may limit your provider's ability to fully assess your condition. If your provider identifies any concerns that need to be evaluated in person or the need to arrange testing (such as labs, EKG, etc.), we will make arrangements to do so. Although advances in technology are sophisticated, we cannot ensure that it will always work on either your end or our end. If the connection with a video visit is poor, the visit may have to be switched to a telephone visit. With either a video or telephone visit, we are not always able to ensure that we have a secure connection.  By engaging in this virtual visit, you consent to the provision of healthcare and authorize for your insurance to be billed (if applicable) for the services provided during this visit. Depending on your insurance coverage, you may receive a charge related to this service.  I need to obtain your verbal consent now. Are you willing to proceed with your visit today? Kristina Pierce has provided verbal consent on 02/25/2022 for a virtual visit (video or telephone). Mar Daring, PA-C  Date: 02/25/2022 8:39 AM  Virtual Visit via Video Note   I, Mar Daring, connected with  Kristina Pierce  ([id number], 09-Dec-1975) on 02/25/22 at  8:30 AM EDT by a video-enabled telemedicine application and verified that I am speaking with the correct person using two identifiers.  Location: Patient: Virtual Visit  Location Patient: Home Provider: Virtual Visit Location Provider: Home Office   I discussed the limitations of evaluation and management by telemedicine and the availability of in person appointments. The patient expressed understanding and agreed to proceed.    History of Present Illness: Kristina Pierce is a 46 y.o. who identifies as a female who was assigned female at birth, and is being seen today for possible sinus infection.  HPI: Sinusitis This is a new problem. The current episode started in the past 7 days. The problem has been gradually worsening since onset. There has been no fever. Associated symptoms include congestion, ear pain (left ear is dull, right is itchy), headaches, sinus pressure and a sore throat (feels full). Pertinent negatives include no chills, coughing or hoarse voice. Treatments tried: tylenol. The treatment provided mild relief.      Problems:  Patient Active Problem List   Diagnosis Date Noted   Worst headache of life 02/22/2022   Dizziness 02/22/2022   Paresthesias 02/22/2022   Kidney stones 11/30/2013    Allergies:  Allergies  Allergen Reactions   Goat Milk Hives    SOB   Goat-Derived Products Hives   Chlorhexidine Itching and Rash   Medications:  Current Outpatient Medications:    amoxicillin-clavulanate (AUGMENTIN) 875-125 MG tablet, Take 1 tablet by mouth 2 (two) times daily., Disp: 14 tablet, Rfl: 0   fluticasone (FLONASE) 50 MCG/ACT nasal spray, Place 2 sprays into both nostrils daily., Disp: 16 g, Rfl: 0   albuterol (VENTOLIN HFA) 108 (  90 Base) MCG/ACT inhaler, Inhale 2 puffs into the lungs every 6 (six) hours as needed for wheezing or shortness of breath., Disp: 8 g, Rfl: 0   famotidine (PEPCID) 20 MG tablet, Take 1 tablet (20 mg total) by mouth 2 (two) times daily., Disp: 60 tablet, Rfl: 2   JUNEL FE 1/20 1-20 MG-MCG tablet, Take 1 tablet by mouth daily., Disp: , Rfl:    Multiple Vitamin (MULTIVITAMIN) tablet, Take 1 tablet by mouth  daily., Disp: , Rfl:   Observations/Objective: Patient is well-developed, well-nourished in no acute distress.  Resting comfortably at home.  Head is normocephalic, atraumatic.  No labored breathing.  Speech is clear and coherent with logical content.  Patient is alert and oriented at baseline.    Assessment and Plan: 1. Acute bacterial sinusitis - amoxicillin-clavulanate (AUGMENTIN) 875-125 MG tablet; Take 1 tablet by mouth 2 (two) times daily.  Dispense: 14 tablet; Refill: 0 - fluticasone (FLONASE) 50 MCG/ACT nasal spray; Place 2 sprays into both nostrils daily.  Dispense: 16 g; Refill: 0  2. Non-recurrent acute suppurative otitis media of right ear without spontaneous rupture of tympanic membrane - amoxicillin-clavulanate (AUGMENTIN) 875-125 MG tablet; Take 1 tablet by mouth 2 (two) times daily.  Dispense: 14 tablet; Refill: 0  - Worsening symptoms that have not responded to OTC medications.  - Will give Augmentin and flonase - Continue allergy medications.  - Steam and humidifier can help - Stay well hydrated and get plenty of rest.  - Seek in person evaluation if no symptom improvement or if symptoms worsen   Follow Up Instructions: I discussed the assessment and treatment plan with the patient. The patient was provided an opportunity to ask questions and all were answered. The patient agreed with the plan and demonstrated an understanding of the instructions.  A copy of instructions were sent to the patient via MyChart unless otherwise noted below.    The patient was advised to call back or seek an in-person evaluation if the symptoms worsen or if the condition fails to improve as anticipated.  Time:  I spent 10 minutes with the patient via telehealth technology discussing the above problems/concerns.    Mar Daring, PA-C

## 2022-02-25 NOTE — Patient Instructions (Signed)
Kristina Pierce, thank you for joining Mar Daring, PA-C for today's virtual visit.  While this provider is not your primary care provider (PCP), if your PCP is located in our provider database this encounter information will be shared with them immediately following your visit.  Consent: (Patient) Kristina Pierce provided verbal consent for this virtual visit at the beginning of the encounter.  Current Medications:  Current Outpatient Medications:    amoxicillin-clavulanate (AUGMENTIN) 875-125 MG tablet, Take 1 tablet by mouth 2 (two) times daily., Disp: 14 tablet, Rfl: 0   fluticasone (FLONASE) 50 MCG/ACT nasal spray, Place 2 sprays into both nostrils daily., Disp: 16 g, Rfl: 0   albuterol (VENTOLIN HFA) 108 (90 Base) MCG/ACT inhaler, Inhale 2 puffs into the lungs every 6 (six) hours as needed for wheezing or shortness of breath., Disp: 8 g, Rfl: 0   famotidine (PEPCID) 20 MG tablet, Take 1 tablet (20 mg total) by mouth 2 (two) times daily., Disp: 60 tablet, Rfl: 2   JUNEL FE 1/20 1-20 MG-MCG tablet, Take 1 tablet by mouth daily., Disp: , Rfl:    Multiple Vitamin (MULTIVITAMIN) tablet, Take 1 tablet by mouth daily., Disp: , Rfl:    Medications ordered in this encounter:  Meds ordered this encounter  Medications   amoxicillin-clavulanate (AUGMENTIN) 875-125 MG tablet    Sig: Take 1 tablet by mouth 2 (two) times daily.    Dispense:  14 tablet    Refill:  0    Order Specific Question:   Supervising Provider    Answer:   MILLER, BRIAN [[id number]]   fluticasone (FLONASE) 50 MCG/ACT nasal spray    Sig: Place 2 sprays into both nostrils daily.    Dispense:  16 g    Refill:  0    Order Specific Question:   Supervising Provider    Answer:   Sabra Heck, [place]     *If you need refills on other medications prior to your next appointment, please contact your pharmacy*  Follow-Up: Call back or seek an in-person evaluation if the symptoms worsen or if the condition fails to  improve as anticipated.  Other Instructions Sinus Infection, Adult A sinus infection, also called sinusitis, is inflammation of your sinuses. Sinuses are hollow spaces in the bones around your face. Your sinuses are located: Around your eyes. In the middle of your forehead. Behind your nose. In your cheekbones. Mucus normally drains out of your sinuses. When your nasal tissues become inflamed or swollen, mucus can become trapped or blocked. This allows bacteria, viruses, and fungi to grow, which leads to infection. Most infections of the sinuses are caused by a virus. A sinus infection can develop quickly. It can last for up to 4 weeks (acute) or for more than 12 weeks (chronic). A sinus infection often develops after a cold. What are the causes? This condition is caused by anything that creates swelling in the sinuses or stops mucus from draining. This includes: Allergies. Asthma. Infection from bacteria or viruses. Deformities or blockages in your nose or sinuses. Abnormal growths in the nose (nasal polyps). Pollutants, such as chemicals or irritants in the air. Infection from fungi. This is rare. What increases the risk? You are more likely to develop this condition if you: Have a weak body defense system (immune system). Do a lot of swimming or diving. Overuse nasal sprays. Smoke. What are the signs or symptoms? The main symptoms of this condition are pain and a feeling of pressure around the affected  sinuses. Other symptoms include: Stuffy nose or congestion that makes it difficult to breathe through your nose. Thick yellow or greenish drainage from your nose. Tenderness, swelling, and warmth over the affected sinuses. A cough that may get worse at night. Decreased sense of smell and taste. Extra mucus that collects in the throat or the back of the nose (postnasal drip) causing a sore throat or bad breath. Tiredness (fatigue). Fever. How is this diagnosed? This condition is  diagnosed based on: Your symptoms. Your medical history. A physical exam. Tests to find out if your condition is acute or chronic. This may include: Checking your nose for nasal polyps. Viewing your sinuses using a device that has a light (endoscope). Testing for allergies or bacteria. Imaging tests, such as an MRI or CT scan. In rare cases, a bone biopsy may be done to rule out more serious types of fungal sinus disease. How is this treated? Treatment for a sinus infection depends on the cause and whether your condition is chronic or acute. If caused by a virus, your symptoms should go away on their own within 10 days. You may be given medicines to relieve symptoms. They include: Medicines that shrink swollen nasal passages (decongestants). A spray that eases inflammation of the nostrils (topical intranasal corticosteroids). Rinses that help get rid of thick mucus in your nose (nasal saline washes). Medicines that treat allergies (antihistamines). Over-the-counter pain relievers. If caused by bacteria, your health care provider may recommend waiting to see if your symptoms improve. Most bacterial infections will get better without antibiotic medicine. You may be given antibiotics if you have: A severe infection. A weak immune system. If caused by narrow nasal passages or nasal polyps, surgery may be needed. Follow these instructions at home: Medicines Take, use, or apply over-the-counter and prescription medicines only as told by your health care provider. These may include nasal sprays. If you were prescribed an antibiotic medicine, take it as told by your health care provider. Do not stop taking the antibiotic even if you start to feel better. Hydrate and humidify  Drink enough fluid to keep your urine pale yellow. Staying hydrated will help to thin your mucus. Use a cool mist humidifier to keep the humidity level in your home above 50%. Inhale steam for 10-15 minutes, 3-4 times a  day, or as told by your health care provider. You can do this in the bathroom while a hot shower is running. Limit your exposure to cool or dry air. Rest Rest as much as possible. Sleep with your head raised (elevated). Make sure you get enough sleep each night. General instructions  Apply a warm, moist washcloth to your face 3-4 times a day or as told by your health care provider. This will help with discomfort. Use nasal saline washes as often as told by your health care provider. Wash your hands often with soap and water to reduce your exposure to germs. If soap and water are not available, use hand sanitizer. Do not smoke. Avoid being around people who are smoking (secondhand smoke). Keep all follow-up visits. This is important. Contact a health care provider if: You have a fever. Your symptoms get worse. Your symptoms do not improve within 10 days. Get help right away if: You have a severe headache. You have persistent vomiting. You have severe pain or swelling around your face or eyes. You have vision problems. You develop confusion. Your neck is stiff. You have trouble breathing. These symptoms may be an emergency. Get  help right away. Call 911. Do not wait to see if the symptoms will go away. Do not drive yourself to the hospital. Summary A sinus infection is soreness and inflammation of your sinuses. Sinuses are hollow spaces in the bones around your face. This condition is caused by nasal tissues that become inflamed or swollen. The swelling traps or blocks the flow of mucus. This allows bacteria, viruses, and fungi to grow, which leads to infection. If you were prescribed an antibiotic medicine, take it as told by your health care provider. Do not stop taking the antibiotic even if you start to feel better. Keep all follow-up visits. This is important. This information is not intended to replace advice given to you by your health care provider. Make sure you discuss any  questions you have with your health care provider. Document Revised: 07/10/2021 Document Reviewed: 07/10/2021 Elsevier Patient Education  [place].   Otitis Media, Adult  Otitis media occurs when there is inflammation and fluid in the middle ear with signs and symptoms of an acute infection. The middle ear is a part of the ear that contains bones for hearing as well as air that helps send sounds to the brain. When infected fluid builds up in this space, it causes pressure and can lead to an ear infection. The eustachian tube connects the middle ear to the back of the nose (nasopharynx) and normally allows air into the middle ear. If the eustachian tube becomes blocked, fluid can build up and become infected. What are the causes? This condition is caused by a blockage in the eustachian tube. This can be caused by mucus or by swelling of the tube. Problems that can cause a blockage include: A cold or other upper respiratory infection. Allergies. An irritant, such as tobacco smoke. Enlarged adenoids. The adenoids are areas of soft tissue located high in the back of the throat, behind the nose and the roof of the mouth. They are part of the body's defense system (immune system). A mass in the nasopharynx. Damage to the ear caused by pressure changes (barotrauma). What increases the risk? You are more likely to develop this condition if you: Smoke or are exposed to tobacco smoke. Have an opening in the roof of your mouth (cleft palate). Have gastroesophageal reflux. Have an immune system disorder. What are the signs or symptoms? Symptoms of this condition include: Ear pain. Fever. Decreased hearing. Tiredness (lethargy). Fluid leaking from the ear, if the eardrum is ruptured or has burst. Ringing in the ear. How is this diagnosed?  This condition is diagnosed with a physical exam. During the exam, your health care provider will use an instrument called an otoscope to look in your  ear and check for redness, swelling, and fluid. He or she will also ask about your symptoms. Your health care provider may also order tests, such as: A pneumatic otoscopy. This is a test to check the movement of the eardrum. It is done by squeezing a small amount of air into the ear. A tympanogram. This is a test that shows how well the eardrum moves in response to air pressure in the ear canal. It provides a graph for your health care provider to review. How is this treated? This condition can go away on its own within 3-5 days. But if the condition is caused by a bacterial infection and does not go away on its own, or if it keeps coming back, your health care provider may: Prescribe antibiotic medicine  to treat the infection. Prescribe or recommend medicines to control pain. Follow these instructions at home: Take over-the-counter and prescription medicines only as told by your health care provider. If you were prescribed an antibiotic medicine, take it as told by your health care provider. Do not stop taking the antibiotic even if you start to feel better. Keep all follow-up visits. This is important. Contact a health care provider if: You have bleeding from your nose. There is a lump on your neck. You are not feeling better in 5 days. You feel worse instead of better. Get help right away if: You have severe pain that is not controlled with medicine. You have swelling, redness, or pain around your ear. You have stiffness in your neck. A part of your face is not moving (paralyzed). The bone behind your ear (mastoid bone) is tender when you touch it. You develop a severe headache. Summary Otitis media is redness, soreness, and swelling of the middle ear, usually resulting in pain and decreased hearing. This condition can go away on its own within 3-5 days. If the problem does not go away in 3-5 days, your health care provider may give you medicines to treat the infection. If you were  prescribed an antibiotic medicine, take it as told by your health care provider. Follow all instructions that were given to you by your health care provider. This information is not intended to replace advice given to you by your health care provider. Make sure you discuss any questions you have with your health care provider. Document Revised: 11/13/2020 Document Reviewed: 11/13/2020 Elsevier Patient Education  [place].    If you have been instructed to have an in-person evaluation today at a local Urgent Care facility, please use the link below. It will take you to a list of all of our available [facility], including address, phone number and hours of operation. Please do not delay care.  [facility]  If you or a family member do not have a primary care provider, use the link below to schedule a visit and establish care. When you choose a [facility] primary care physician or advanced practice provider, you gain a long-term partner in health. Find a Primary Care Provider  Learn more about [facility]'s in-office and virtual care options: [employer] [facility]

## 2022-03-02 ENCOUNTER — Ambulatory Visit
Admission: RE | Admit: 2022-03-02 | Discharge: 2022-03-02 | Disposition: A | Discharge status: Home / Self Care | Payer: BC Managed Care – PPO | Source: Ambulatory Visit | Attending: Family Medicine | Admitting: Family Medicine

## 2022-03-02 DIAGNOSIS — R519 Headache, unspecified: Secondary | ICD-10-CM

## 2022-03-02 DIAGNOSIS — R202 Paresthesia of skin: Secondary | ICD-10-CM

## 2022-03-02 DIAGNOSIS — R42 Dizziness and giddiness: Secondary | ICD-10-CM

## 2022-03-05 ENCOUNTER — Encounter: Payer: Self-pay | Admitting: Family Medicine

## 2022-03-05 ENCOUNTER — Encounter: Payer: Self-pay | Admitting: Diagnostic Neuroimaging

## 2022-03-05 ENCOUNTER — Ambulatory Visit: Payer: BC Managed Care – PPO | Admitting: Family Medicine

## 2022-03-05 ENCOUNTER — Ambulatory Visit: Payer: BC Managed Care – PPO | Admitting: Diagnostic Neuroimaging

## 2022-03-05 VITALS — BP 113/78 | HR 85 | Ht 65.0 in | Wt 171.8 lb

## 2022-03-05 VITALS — BP 102/66 | HR 82 | Temp 98.0°F | Ht 65.0 in | Wt 172.0 lb

## 2022-03-05 DIAGNOSIS — G43009 Migraine without aura, not intractable, without status migrainosus: Secondary | ICD-10-CM

## 2022-03-05 DIAGNOSIS — R202 Paresthesia of skin: Secondary | ICD-10-CM

## 2022-03-05 DIAGNOSIS — R200 Anesthesia of skin: Secondary | ICD-10-CM

## 2022-03-05 DIAGNOSIS — R42 Dizziness and giddiness: Secondary | ICD-10-CM | POA: Diagnosis not present

## 2022-03-05 DIAGNOSIS — Z9189 Other specified personal risk factors, not elsewhere classified: Secondary | ICD-10-CM

## 2022-03-05 DIAGNOSIS — E538 Deficiency of other specified B group vitamins: Secondary | ICD-10-CM

## 2022-03-05 DIAGNOSIS — G4489 Other headache syndrome: Secondary | ICD-10-CM

## 2022-03-05 DIAGNOSIS — R519 Headache, unspecified: Secondary | ICD-10-CM

## 2022-03-05 NOTE — Patient Instructions (Signed)
Preventing Heat Exhaustion, Adult Heat exhaustion happens when your body gets too hot (overheated) from hot weather, exercise, strenuous physical activity, dehydration, or a combination of these. If untreated, heat exhaustion could lead to heat stroke, which is a medical emergency and can be life-threatening. How can heat exhaustion affect me? Early warning signs of heat exhaustion include: Weakness. Fatigue. Heat rash. This red, blotchy skin rash is also called prickly heat. Swelling of the legs and feet. Stomach cramps. Arm pain. Leg cramps. When heat exhaustion symptoms progress, they can include: Heavy sweating. Body temperature of 102.87F (39C) or higher. Clammy skin. Rapid, weak pulse. Nausea or vomiting. Dizziness. Headache. Difficulty focusing or concentrating. Fainting. If you have signs of heat exhaustion, move to a cool place, loosen your clothing, and drink water or a sports drink. Then do these things: Put cool, wet compresses on your body or get into a cool bath or shower. Remove all unnecessary clothing to expose as much skin as possible to ventilated air. Fans and cool water mists over your skin are very important. What can increase my risk? People who work or exercise outside in hot weather have the highest risk of heat exhaustion. You may also be at higher risk if you: Are over age 64. Older adults have a greater risk for heat exhaustion than younger adults. Live alone, especially in a home without air conditioning or proper ventilation. Are very overweight (obese). Have high blood pressure. Have certain chronic medical conditions, such as heart disease, poor circulation or other vascular diseases, sickle cell disease, high blood pressure, diabetes, lung disease, or cystic fibrosis, or you have had a stroke. Take certain medicines for high blood pressure, or you take antihistamines or tranquilizers. Drink alcohol excessively or use drugs, such as cocaine, heroin, or  amphetamines. What actions can I take to prevent heat exhaustion? Eating and drinking     Drink enough water or sports drinks to keep your urine pale yellow. When it is hot, drink every 15 to 20 minutes, even if you are not thirsty. Do not go out in the heat after a heavy meal. Do not drink alcohol or caffeinated drinks when it is very hot outside. Lifestyle Avoid being outside on very hot days. Check your local news for extreme heat alerts or warnings. In extreme heat, stay in an air-conditioned environment until the temperature cools off. Wear lightweight, light-colored, and loose-fitting clothing in warm weather. This allows for better air circulation over the skin. Protect yourself from the sun by wearing a broad-brimmed hat and using a broad-spectrum sunscreen that is SPF 15 or higher. Activity Check with your health care provider before starting any new exercise or activity. Ask about any health conditions or medicines that might increase your risk for heat exhaustion. Do outdoor activities when it is cooler. This may be in the morning, late afternoon, or evening. Take breaks in the shade. Do not work or exercise in the heat when you feel unwell or have been sick. Start any new work or exercise activity gradually. General information Older adults need extra precautions to protect against heat exhaustion. Follow these tips: Do not leave an older adult alone in a hot car. Make sure older adults have access to air conditioning on very hot days. Remind them to drink enough fluids. Check on them at least twice a day if you can. Where to find more information Centers for Disease Control and Prevention: http://www.wolf.info/ American Academy of Family Physicians: familydoctor.[place] [employer]: orthoinfo.aaos.org  Contact a health care provider if you: Faint. Feel weak or dizzy. Have any signs or symptoms of heat exhaustion that last more than 1 hour. These may include  muscle cramps, fatigue, redness of the face and neck, and headache. Get help right away if you: Have signs of heat stroke, including: Body temperature of 104F (40C) or higher. Hot, dry, red skin. Fast, thumping pulse. Severe nausea, vomiting, or both. Confusion. These symptoms may represent a serious problem that is an emergency. Do not wait to see if the symptoms will go away. Get medical help right away. Call your local emergency services (911 in the [place] Do not drive yourself to the hospital. Summary Heat exhaustion happens when your body gets too hot (overheated) from hot weather, exercise, strenuous physical activity, dehydration, or a combination of these. Avoid being outside on very hot days. When you are out in the heat, stay hydrated, wear light clothing, and take breaks in shaded areas to cool down. If you have signs of heat exhaustion, get out of the heat, drink fluids, take steps to cool down, and contact a health care provider. Cooling down may include taking off all unnecessary clothing, using cooling fans, and water misting. Make sure you are drinking fluids, such as water or sports drinks with electrolytes. Contact a health care provider if you have signs or symptoms of heat exhaustion that last more than 1 hour, such as muscle cramps, fatigue, and headache. This information is not intended to replace advice given to you by your health care provider. Make sure you discuss any questions you have with your health care provider. Document Revised: 01/31/2020 Document Reviewed: 01/31/2020 Elsevier Patient Education  [place].

## 2022-03-05 NOTE — Patient Instructions (Signed)
  NUMBNESS - start B12 supplement 1017mg daily  MIGRAINE VARIANT - ibuprofen, tylenol as needed - consider triptan and prevention meds in future   To prevent or relieve headaches, try the following:  Cool Compress. Lie down and place a cool compress on your head.   Avoid headache triggers. If certain foods or odors seem to have triggered your migraines in the past, avoid them. A headache diary might help you identify triggers.   Include physical activity in your daily routine.  Manage stress. Find healthy ways to cope with the stressors, such as delegating tasks on your to-do list.   Practice relaxation techniques. Try deep breathing, yoga, massage and visualization.   Eat regularly. Eating regularly scheduled meals and maintaining a healthy diet might help prevent headaches. Also, drink plenty of fluids.   Follow a regular sleep schedule. Sleep deprivation might contribute to headaches Consider biofeedback. With this mind-body technique, you learn to control certain bodily functions -- such as muscle tension, heart rate and blood pressure -- to prevent headaches or reduce headache pain.

## 2022-03-05 NOTE — Progress Notes (Signed)
[facility]  PATIENT: Kristina Pierce DOB: 1976/03/27  REFERRING CLINICIAN: Girtha Rm, NP-C HISTORY FROM: patient REASON FOR VISIT: new consult   HISTORICAL  CHIEF COMPLAINT:  Chief Complaint  Patient presents with   Dizziness    Rm 7 New Pt  "starting in May-tingling from elbow to fingertips, motion sickness with it, then headaches start; in July had bad episode while driving- felt like I was floating; eye exam NL, had MRI but don't know results yet"    Headache    HISTORY OF PRESENT ILLNESS:   46 year old female here for evaluation of numbness and headaches.  History of migraine headaches in college with sensitive to light and sound, throbbing headaches lasting 2 to 3 hours, well controlled with Imitrex.  Headache spontaneously resolved after graduation.  May 2023 patient had onset of numbness in bilateral elbows to hands, right or left side, while driving.  When she came to her stoplight she felt exploding motion sensitive and sickness feeling lasting for 10 seconds.  Also had some headache moving from the right to left side.  Has had recurrence of these kinds of symptoms several times in May, June and July.  Went to the emergency room in July for severe to severe headache.  Had MRI of the brain recently which was unremarkable.  No specific triggering aggravating factors.  Has not returned to driving.  Has been in the passenger seat without recurrence of symptoms.   REVIEW OF SYSTEMS: Full 14 system review of systems performed and negative with exception of: as per HPI.  ALLERGIES: Allergies  Allergen Reactions   Goat Milk Hives    SOB   Goat-Derived Products Hives   Chlorhexidine Itching and Rash    HOME MEDICATIONS: Outpatient Medications Prior to Visit  Medication Sig Dispense Refill   amoxicillin-clavulanate (AUGMENTIN) 875-125 MG tablet Take 1 tablet by mouth 2 (two) times daily. 14 tablet 0   famotidine (PEPCID) 20 MG tablet Take  1 tablet (20 mg total) by mouth 2 (two) times daily. 60 tablet 2   fluticasone (FLONASE) 50 MCG/ACT nasal spray Place 2 sprays into both nostrils daily. 16 g 0   JUNEL FE 1/20 1-20 MG-MCG tablet Take 1 tablet by mouth daily.     Multiple Vitamin (MULTIVITAMIN) tablet Take 1 tablet by mouth daily.     albuterol (VENTOLIN HFA) 108 (90 Base) MCG/ACT inhaler Inhale 2 puffs into the lungs every 6 (six) hours as needed for wheezing or shortness of breath. (Patient not taking: Reported on 03/05/2022) 8 g 0   No facility-administered medications prior to visit.    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 Berenice Primas. Tried systemic prednisone-unable to tolerate SE-HA, dizzy. Discussed steroid injection.   Neck pain 07/18/2020   Neuropathy    Pain in joint of right shoulder 07/18/2020   Paresthesia of both hands 11/30/2013   Duration few years, intermittent. More intense last 14 days.   Paresthesia of both lower extremities 11/30/2013   Duration 6 years. Intermittent. Persistent in last 14 days. Reviewed Neuro notes 12/23/13: Dr Vallarie Mare [facility]: small fiber neuropathy is likely, large fiber not r/o, amyloidosis is possiblity. Labs & NCV/EMG tests pending   Radiculopathy of cervical spine    Urine  incontinence     PAST SURGICAL HISTORY: Past Surgical History:  Procedure Laterality Date   APPENDECTOMY  2012   CERVICAL SPINE SURGERY  07/18/2020   kidney stone removal  2011   TONSILLECTOMY  1996   WISDOM TOOTH EXTRACTION      FAMILY HISTORY: Family History  Problem Relation Age of Onset   Dystonia Mother        cervical dystonia from trauma   Kidney disease Mother        stones   Breast cancer Mother    Arthritis Mother    Hypertension Father    Hyperlipidemia Father    Other Father         amyloidosis   Hyperlipidemia Sister    Kidney disease Sister        stones   Other Sister        hearing loss, etio?   ALS Paternal Uncle    Multiple sclerosis Paternal Uncle    Diabetes Paternal Uncle    Kidney cancer Maternal Grandfather    Heart disease Paternal Grandmother    Heart disease Paternal Grandfather    Colon cancer Maternal Grandmother    Esophageal cancer Neg Hx    Rectal cancer Neg Hx    Stomach cancer Neg Hx     SOCIAL HISTORY: Social History   Socioeconomic History   Marital status: Widowed    Spouse name: Not on file   Number of children: 2   Years of education: Master   Highest education level: Not on file  Occupational History   Occupation: Product manager: [employer]    Comment: HS chemistry  Tobacco Use   Smoking status: Never   Smokeless tobacco: Never  Vaping Use   Vaping Use: Never used  Substance and Sexual Activity   Alcohol use: No   Drug use: No   Sexual activity: Yes    Partners: Male    Birth control/protection: Pill    Comment: married  Other Topics Concern   Not on file  Social History Narrative   Ms. Gato lives with her daughter [name]). She is a widow.    She works Medical laboratory scientific officer at [employer], Research scientist (medical).   Masters degree.   Drinks caffeine, takes a daily vitamin.   Wear seatbelt, wears a bicycle helmet, smoke detector at home.   Feels safe in her relationships.   Social Determinants of Health   Financial Resource Strain: Not on file  Food Insecurity: Not on file  Transportation Needs: Not on file  Physical Activity: Not on file  Stress: Not on file  Social Connections: Not on file  Intimate Partner Violence: Not on file     PHYSICAL EXAM  GENERAL EXAM/CONSTITUTIONAL: Vitals:  Vitals:   03/05/22 0941  BP: 113/78  Pulse: 85  Weight: 171 lb 12.8 oz (77.9 kg)  Height: _0  (1.651 m)   Body mass index is 28.59 kg/m. Wt Readings from Last 3 Encounters:  03/05/22 171 lb 12.8 oz (77.9 kg)   02/21/22 171 lb (77.6 kg)  02/17/22 173 lb 15.1 oz (78.9 kg)   Patient is in no distress; well developed, nourished and groomed; neck is supple  CARDIOVASCULAR: Examination of carotid arteries is normal; no carotid bruits Regular rate and rhythm, no murmurs Examination of peripheral vascular system by observation and palpation is normal  EYES: Ophthalmoscopic exam of optic discs and posterior segments is normal; no papilledema or hemorrhages No results found.  MUSCULOSKELETAL: Gait, strength,  tone, movements noted in Neurologic exam below  NEUROLOGIC: MENTAL STATUS:      No data to display         awake, alert, oriented to person, place and time recent and remote memory intact normal attention and concentration language fluent, comprehension intact, naming intact fund of knowledge appropriate  CRANIAL NERVE:  2nd - no papilledema on fundoscopic exam 2nd, 3rd, 4th, 6th - pupils equal and reactive to light, visual fields full to confrontation, extraocular muscles intact, no nystagmus 5th - facial sensation symmetric 7th - facial strength symmetric 8th - hearing intact 9th - palate elevates symmetrically, uvula midline 11th - shoulder shrug symmetric 12th - tongue protrusion midline  MOTOR:  normal bulk and tone, full strength in the BUE, BLE  SENSORY:  normal and symmetric to light touch, temperature, vibration  COORDINATION:  finger-nose-finger, fine finger movements normal  REFLEXES:  deep tendon reflexes present and symmetric  GAIT/STATION:  narrow based gait     DIAGNOSTIC DATA (LABS, IMAGING, TESTING) - I reviewed patient records, labs, notes, testing and imaging myself where available.  Lab Results  Component Value Date   WBC 6.5 02/17/2022   HGB 14.0 02/17/2022   HCT 40.2 02/17/2022   MCV 92.8 02/17/2022   PLT 262 02/17/2022      Component Value Date/Time   NA 141 02/21/2022 1713   K 4.0 02/21/2022 1713   CL 105 02/21/2022 1713   CO2  25 02/21/2022 1713   GLUCOSE 87 02/21/2022 1713   BUN 15 02/21/2022 1713   CREATININE 0.76 02/21/2022 1713   CALCIUM 9.3 02/21/2022 1713   PROT 7.6 02/21/2022 1713   ALBUMIN 4.3 02/21/2022 1713   AST 13 02/21/2022 1713   ALT 11 02/21/2022 1713   ALKPHOS 43 02/21/2022 1713   BILITOT 0.3 02/21/2022 1713   GFRNONAA >60 02/17/2022 0534   GFRAA  12/14/2010 1727    >60        The eGFR has been calculated using the MDRD equation. This calculation has not been validated in all clinical situations. eGFR's persistently <60 mL/min signify possible Chronic Kidney Disease.   Lab Results  Component Value Date   CHOL 218 (H) 07/09/2021   HDL 55.40 07/09/2021   LDLCALC 138 (H) 07/09/2021   TRIG 123.0 07/09/2021   CHOLHDL 4 07/09/2021   Lab Results  Component Value Date   HGBA1C 5.5 07/09/2021   Lab Results  Component Value Date   VITAMINB12 224 02/21/2022   Lab Results  Component Value Date   TSH 1.04 02/21/2022    03/02/22 MRI brain [I reviewed images myself and agree with interpretation. Few non-specific foci of gliosis. -VRP]  - No evidence of recent infarction, hemorrhage, or mass.    ASSESSMENT AND PLAN  46 y.o. year old female here with:   Dx:  1. Numbness   2. Nonintractable headache, unspecified chronicity pattern, unspecified headache type   3. Atypical migraine   4. Migraine without aura and without status migrainosus, not intractable       PLAN:  NUMBNESS (could be related to migraine phenomenon vs intermittent positional / compression) - start B12 supplement 1019mg daily (low normal; repeat albs in 3-6 months with PCP)  MIGRAINE VARIANT - ibuprofen, tylenol as needed - consider triptan and prevention meds in future   To prevent or relieve headaches, try the following:  Cool Compress. Lie down and place a cool compress on your head.   Avoid headache triggers. If certain foods or odors seem  to have triggered your migraines in the past, avoid them.  A headache diary might help you identify triggers.   Include physical activity in your daily routine.  Manage stress. Find healthy ways to cope with the stressors, such as delegating tasks on your to-do list.   Practice relaxation techniques. Try deep breathing, yoga, massage and visualization.   Eat regularly. Eating regularly scheduled meals and maintaining a healthy diet might help prevent headaches. Also, drink plenty of fluids.   Follow a regular sleep schedule. Sleep deprivation might contribute to headaches Consider biofeedback. With this mind-body technique, you learn to control certain bodily functions -- such as muscle tension, heart rate and blood pressure -- to prevent headaches or reduce headache pain.  Return for pending if symptoms worsen or fail to improve.    Penni Bombard, MD 10/27/2160, 44:69 AM Certified in Neurology, Neurophysiology and Neuroimaging  [facility] [address], [place], [place] [address] [phone]

## 2022-03-05 NOTE — Progress Notes (Signed)
Kristina Pierce , September 07, 1975, 46 y.o., female MRN: [id number] Patient Care Team    Relationship Specialty Notifications Start End  [name], DO PCP - General Family Medicine  12/02/19   [name], MD Consulting Physician Urology  08/14/16   [name], MD Consulting Physician Obstetrics and Gynecology  04/30/18   [name], MD Consulting Physician Gastroenterology  07/09/21     Chief Complaint  Patient presents with   Dizziness    Pt c/o dizziness, L earache, tinnitus x 1 mo;      Subjective: Pt presents for an OV with complaints of dizziness.  She has been seen in the ED early July and by neurology earlier today.  ED felt she was having cluster headaches and neurology felt she is having atypical migraines. She was noted to have low B12 on labs, she has not started B12 supplement as of yet.  12 levels were 224.  CMP was normal.  CBC with differential normal.  Thyroid was normal.  MRI of brain without contrast did not show any acute changes to explain dizziness. In this course, she was treated with Augmentin and Flonase as well.  Per patient today she has noticed the dizziness mostly when going over or passes.  She states she does have a fear of heights, but this has never bothered her before to go over an overpass.  It is only when she is driving and not as a passenger.  This sensation is not so much dizziness as a room bending but more of a movement sensation. The day she had the worst headache of her life in the evening, she was woken at 2 AM with a frontal headache.  She states that day she was in the sun swimming all day with her children.  When she woke up at 2 AM she took an Excedrin and placed a cool cloth across her forehead.  She states it seemed to help but she then could not go to sleep.  Since she could not sleep she started to worry something was wrong with her and it caused her increased anxiety and stress.  She then got up at 4 AM and  realized her heart rate was 150/100 so she called EMS to be checked and blood pressure was about the same and heart rate was 90.     07/09/2021   10:29 AM 04/30/2018    9:10 AM 08/14/2016    8:20 AM  Depression screen PHQ 2/9  Decreased Interest 0 0 0  Down, Depressed, Hopeless 0 0 0  PHQ - 2 Score 0 0 0    Allergies  Allergen Reactions   Goat Milk Hives    SOB   Goat-Derived Products Hives   Chlorhexidine Itching and Rash   Social History   Social History Narrative   Ms. Coghlan lives with her daughter [name]). She is a widow.    She works Medical laboratory scientific officer at [employer], Research scientist (medical).   Masters degree.   Drinks caffeine, takes a daily vitamin.   Wear seatbelt, wears a bicycle helmet, smoke detector at home.   Feels safe in her relationships.   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 Berenice Primas. Tried systemic prednisone-unable to tolerate SE-HA, dizzy.  Discussed steroid injection.   Neck pain 07/18/2020   Neuropathy    Pain in joint of right shoulder 07/18/2020   Paresthesia of both hands 11/30/2013   Duration few years, intermittent. More intense last 14 days.   Paresthesia of both lower extremities 11/30/2013   Duration 6 years. Intermittent. Persistent in last 14 days. Reviewed Neuro notes 12/23/13: Dr Vallarie Mare [facility]: small fiber neuropathy is likely, large fiber not r/o, amyloidosis is possiblity. Labs & NCV/EMG tests pending   Radiculopathy of cervical spine    Urine incontinence    Past Surgical History:  Procedure Laterality Date   APPENDECTOMY  2012   CERVICAL SPINE SURGERY  07/18/2020   kidney stone removal  2011   TONSILLECTOMY  1996   WISDOM TOOTH EXTRACTION     Family History  Problem Relation Age of Onset   Dystonia Mother        cervical dystonia from trauma   Kidney disease Mother         stones   Breast cancer Mother    Arthritis Mother    Hypertension Father    Hyperlipidemia Father    Other Father        amyloidosis   Hyperlipidemia Sister    Kidney disease Sister        stones   Other Sister        hearing loss, etio?   ALS Paternal Uncle    Multiple sclerosis Paternal Uncle    Diabetes Paternal Uncle    Kidney cancer Maternal Grandfather    Heart disease Paternal Grandmother    Heart disease Paternal Grandfather    Colon cancer Maternal Grandmother    Esophageal cancer Neg Hx    Rectal cancer Neg Hx    Stomach cancer Neg Hx    Allergies as of 03/05/2022       Reactions   Goat Milk Hives   SOB   Goat-derived Products Hives   Chlorhexidine Itching, Rash        Medication List        Accurate as of March 05, 2022 11:59 PM. If you have any questions, ask your nurse or doctor.          STOP taking these medications    amoxicillin-clavulanate 875-125 MG tablet Commonly known as: AUGMENTIN Stopped by: Howard Pouch, DO       TAKE these medications    albuterol 108 (90 Base) MCG/ACT inhaler Commonly known as: VENTOLIN HFA Inhale 2 puffs into the lungs every 6 (six) hours as needed for wheezing or shortness of breath.   famotidine 20 MG tablet Commonly known as: PEPCID Take 1 tablet (20 mg total) by mouth 2 (two) times daily.   fluticasone 50 MCG/ACT nasal spray Commonly known as: FLONASE Place 2 sprays into both nostrils daily.   Junel FE 1/20 1-20 MG-MCG tablet Generic drug: norethindrone-ethinyl estradiol-FE Take 1 tablet by mouth daily.   multivitamin tablet Take 1 tablet by mouth daily.        All past medical history, surgical history, allergies, family history, immunizations andmedications were updated in the EMR today and reviewed under the history and medication portions of their EMR.     ROS Negative, with the exception of above mentioned in HPI   Objective:  BP 102/66   Pulse 82   Temp 98 F (36.7 C)  (Oral)   Ht '5\' 5"'$  (1.651 m)   Wt 172 lb (78 kg)   LMP 02/28/2022   SpO2 97%  BMI 28.62 kg/m  Body mass index is 28.62 kg/m. Physical Exam Vitals and nursing note reviewed.  Constitutional:      General: She is not in acute distress.    Appearance: Normal appearance. She is normal weight. She is not ill-appearing or toxic-appearing.  Eyes:     Extraocular Movements: Extraocular movements intact.     Conjunctiva/sclera: Conjunctivae normal.     Pupils: Pupils are equal, round, and reactive to light.  Skin:    Findings: No rash.  Neurological:     Mental Status: She is alert and oriented to person, place, and time. Mental status is at baseline.  Psychiatric:        Attention and Perception: Attention and perception normal.        Mood and Affect: Mood normal.        Speech: Speech normal.        Behavior: Behavior normal.        Thought Content: Thought content normal.        Cognition and Memory: Cognition and memory normal.        Judgment: Judgment normal.     Comments: Mildly anxious      No results found. No results found. No results found for this or any previous visit (from the past 24 hour(s)).  Assessment/Plan: CALLIA SWIM is a 47 y.o. female present for OV for  Dizziness The dizziness she is experiencing seems to only occur when she is driving over over passes.  Seems to be more consistent with vertigo or sensation of movement rather than a room spinning type of dizziness.  Suspect this is created by anxiety.  She has a fear of heights. We discussed potential triggers today and she is uncertain why this has become a trigger for her.  Offered referral to therapy and she would like to wait for now but if she changes her mind she will call back and we will be happy to place this for her.  Paresthesias/B12 deficiency B12 is significantly low.  Encouraged her to start sublingual B12 1000 mcg daily. This will help with the paresthesias and help with her chronic  headaches as well.  Other headache syndrome Suspect her headaches are multifactorial.  She does have some chronic headaches that hopefully will respond well to the B12 supplement.  Also encouraged her to consider low-dose magnesium supplement. She did have 1 headache that was rather significant over the last month and this hdx seems consistent with possible heat exhaustion.  She described herself as being extremely fatigued after being out in the sun all day and then she woke up with a headache at 2 AM.  Seem to mostly respond to the cool washcloth she placed on her head.  We discussed preventative measures and signs and symptoms of heat exhaustion/heat stroke.  Reviewed expectations re: course of current medical issues. Discussed self-management of symptoms. Outlined signs and symptoms indicating need for more acute intervention. Patient verbalized understanding and all questions were answered. Patient received an After-Visit Summary.    No orders of the defined types were placed in this encounter.  No orders of the defined types were placed in this encounter.  Referral Orders  No referral(s) requested today     Note is dictated utilizing voice recognition software. Although note has been proof read prior to signing, occasional typographical errors still can be missed. If any questions arise, please do not hesitate to call for verification.   electronically signed by:  Howard Pouch,  DO  [facility] [place]

## 2022-03-07 DIAGNOSIS — E538 Deficiency of other specified B group vitamins: Secondary | ICD-10-CM | POA: Insufficient documentation

## 2022-04-12 ENCOUNTER — Ambulatory Visit: Payer: BC Managed Care – PPO | Admitting: Family Medicine

## 2022-04-12 ENCOUNTER — Encounter: Payer: Self-pay | Admitting: Family Medicine

## 2022-04-12 VITALS — BP 118/72 | HR 69 | Temp 98.0°F | Ht 65.0 in | Wt 172.0 lb

## 2022-04-12 DIAGNOSIS — E538 Deficiency of other specified B group vitamins: Secondary | ICD-10-CM | POA: Diagnosis not present

## 2022-04-12 DIAGNOSIS — G4489 Other headache syndrome: Secondary | ICD-10-CM | POA: Diagnosis not present

## 2022-04-12 DIAGNOSIS — R202 Paresthesia of skin: Secondary | ICD-10-CM

## 2022-04-12 DIAGNOSIS — R42 Dizziness and giddiness: Secondary | ICD-10-CM

## 2022-04-12 MED ORDER — HYDROXYZINE HCL 10 MG PO TABS: 10.0000 mg | ORAL_TABLET | Freq: Every evening | ORAL | 0 refills | Status: DC | PRN

## 2022-04-12 NOTE — Progress Notes (Signed)
Kristina Pierce , Jul 29, 1976, 46 y.o., female MRN: [id number] Patient Care Team    Relationship Specialty Notifications Start End  [name], DO PCP - General Family Medicine  12/02/19   [name], MD Consulting Physician Urology  08/14/16   [name], MD Consulting Physician Obstetrics and Gynecology  04/30/18   [name], MD Consulting Physician Gastroenterology  07/09/21     Chief Complaint  Patient presents with   Ear Pain    Pt c/o b/l earache, HA, dizziness, dysphagia, L TMJ pain x 3 mos     Subjective: Pt presents for an OV with  multiple complaints she is unsure if are related.  Patient reports she has had an earache that started last weekend, currently on day 6.  She felt her ear felt full if she took a Zyrtec x1.  Then this past Tuesday she felt pressure or pain in her ear and she was seen at the urgent care.  She was told she did not have an ear infection but they gave her meclizine and amoxicillin. Sunday she had vertigo.  She had been for this issue just a few weeks ago (see below).  She states it is now not just when she is driving but can happen at any time.  She has noticed increased over the last few days occurring up to 4-5 times a day.  She had a headache.  She describes as tension headache can last a few minutes up to an hour.  She feels her tongue is more thick, and that is causing her to have difficulty swallowing.  She has started the B12 and it did help with her energy.  She denies fevers or chills.  She denies acute illness.  She endorses increased rest. Prior note: Pt presents for an OV with complaints of dizziness.  She has been seen in the ED early July and by neurology earlier today.  ED felt she was having cluster headaches and neurology felt she is having atypical migraines. She was noted to have low B12 on labs, she has not started B12 supplement as of yet.  12 levels were 224.  CMP was normal.  CBC with differential normal.   Thyroid was normal.  MRI of brain without contrast did not show any acute changes to explain dizziness. In this course, she was treated with Augmentin and Flonase as well.   Per patient today she has noticed the dizziness mostly when going over or passes.  She states she does have a fear of heights, but this has never bothered her before to go over an overpass.  It is only when she is driving and not as a passenger.  This sensation is not so much dizziness as a room bending but more of a movement sensation. The day she had the worst headache of her life in the evening, she was woken at 2 AM with a frontal headache.  She states that day she was in the sun swimming all day with her children.  When she woke up at 2 AM she took an Excedrin and placed a cool cloth across her forehead.  She states it seemed to help but she then could not go to sleep.  Since she could not sleep she started to worry something was wrong with her and it caused her increased anxiety and stress.  She then got up at 4 AM and realized her heart rate was 150/100 so she called EMS to be  checked and blood pressure was about the same and heart rate was 90.     07/09/2021   10:29 AM 04/30/2018    9:10 AM 08/14/2016    8:20 AM  Depression screen PHQ 2/9  Decreased Interest 0 0 0  Down, Depressed, Hopeless 0 0 0  PHQ - 2 Score 0 0 0    Allergies  Allergen Reactions   Goat Milk Hives    SOB   Goat-Derived Products Hives   Chlorhexidine Itching and Rash   Social History   Social History Narrative   Kristina Pierce lives with her daughter [name]). She is a widow.    She works Medical laboratory scientific officer at [employer], Research scientist (medical).   Masters degree.   Drinks caffeine, takes a daily vitamin.   Wear seatbelt, wears a bicycle helmet, smoke detector at home.   Feels safe in her relationships.   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 Berenice Primas. Tried systemic prednisone-unable to tolerate SE-HA, dizzy. Discussed steroid injection.   Neck pain 07/18/2020   Neuropathy    Pain in joint of right shoulder 07/18/2020   Paresthesia of both hands 11/30/2013   Duration few years, intermittent. More intense last 14 days.   Paresthesia of both lower extremities 11/30/2013   Duration 6 years. Intermittent. Persistent in last 14 days. Reviewed Neuro notes 12/23/13: Dr Vallarie Mare [facility]: small fiber neuropathy is likely, large fiber not r/o, amyloidosis is possiblity. Labs & NCV/EMG tests pending   Radiculopathy of cervical spine    Urine incontinence    Past Surgical History:  Procedure Laterality Date   APPENDECTOMY  2012   CERVICAL SPINE SURGERY  07/18/2020   kidney stone removal  2011   TONSILLECTOMY  1996   WISDOM TOOTH EXTRACTION     Family History  Problem Relation Age of Onset   Dystonia Mother        cervical dystonia from trauma   Kidney disease Mother        stones   Breast cancer Mother    Arthritis Mother    Hypertension Father    Hyperlipidemia Father    Other Father        amyloidosis   Hyperlipidemia Sister    Kidney disease Sister        stones   Other Sister        hearing loss, etio?   ALS Paternal Uncle    Multiple sclerosis Paternal Uncle    Diabetes Paternal Uncle    Kidney cancer Maternal Grandfather    Heart disease Paternal Grandmother    Heart disease Paternal Grandfather    Colon cancer Maternal Grandmother    Esophageal cancer Neg Hx    Rectal cancer Neg Hx    Stomach cancer Neg Hx    Allergies as of 04/12/2022       Reactions   Goat Milk Hives   SOB   Goat-derived Products Hives   Chlorhexidine Itching, Rash        Medication List        Accurate as of April 12, 2022 11:59 PM. If you have any questions, ask your nurse or doctor.          albuterol 108 (90 Base) MCG/ACT inhaler Commonly known as: VENTOLIN  HFA Inhale 2 puffs into the lungs every 6 (six) hours as needed for wheezing or shortness of breath.   cyanocobalamin 1000 MCG tablet Take 1,000 mcg by mouth daily.   famotidine 20 MG tablet Commonly known as: PEPCID Take 1 tablet (20 mg total) by mouth 2 (two) times daily.   fluticasone 50 MCG/ACT nasal spray Commonly known as: FLONASE Place 2 sprays into both nostrils daily.   hydrOXYzine 10 MG tablet Commonly known as: ATARAX Take 1-3 tablets (10-30 mg total) by mouth at bedtime as needed. Started by: Howard Pouch, DO   Junel FE 1/20 1-20 MG-MCG tablet Generic drug: norethindrone-ethinyl estradiol-FE Take 1 tablet by mouth daily.   multivitamin tablet Take 1 tablet by mouth daily.        All past medical history, surgical history, allergies, family history, immunizations andmedications were updated in the EMR today and reviewed under the history and medication portions of their EMR.     ROS Negative, with the exception of above mentioned in HPI   Objective:  BP 118/72   Pulse 69   Temp 98 F (36.7 C) (Oral)   Ht 5' 5"  (1.651 m)   Wt 172 lb (78 kg)   LMP 03/12/2022   SpO2 99%   BMI 28.62 kg/m  Body mass index is 28.62 kg/m. Physical Exam Vitals and nursing note reviewed.  Constitutional:      General: She is not in acute distress.    Appearance: Normal appearance. She is normal weight. She is not ill-appearing, toxic-appearing or diaphoretic.  HENT:     Head: Normocephalic and atraumatic.     Right Ear: Tympanic membrane, ear canal and external ear normal. There is no impacted cerumen.     Left Ear: Tympanic membrane, ear canal and external ear normal. There is no impacted cerumen.     Nose: No congestion or rhinorrhea.     Mouth/Throat:     Mouth: Mucous membranes are moist.     Pharynx: No oropharyngeal exudate or posterior oropharyngeal erythema.  Eyes:     General: No scleral icterus.       Right eye: No discharge.        Left eye: No discharge.      Extraocular Movements: Extraocular movements intact.     Conjunctiva/sclera: Conjunctivae normal.     Pupils: Pupils are equal, round, and reactive to light.  Cardiovascular:     Rate and Rhythm: Normal rate and regular rhythm.     Heart sounds: No murmur heard. Pulmonary:     Effort: Pulmonary effort is normal.     Breath sounds: Normal breath sounds. No wheezing, rhonchi or rales.  Musculoskeletal:     Cervical back: No rigidity or tenderness.     Right lower leg: No edema.     Left lower leg: No edema.  Lymphadenopathy:     Cervical: No cervical adenopathy.  Skin:    Findings: No rash.  Neurological:     Mental Status: She is alert and oriented to person, place, and time. Mental status is at baseline.     Motor: No weakness.     Coordination: Coordination normal.     Gait: Gait normal.  Psychiatric:        Mood and Affect: Mood normal.        Behavior: Behavior normal.        Thought Content: Thought content normal.        Judgment: Judgment normal.      No  results found. No results found. No results found for this or any previous visit (from the past 24 hour(s)).  Assessment/Plan: Kristina Pierce is a 46 y.o. female present for OV for  Dizziness /vertigo Usually only occurring when driving, now occurring a couple times a day.  Could be secondary to anxiety/stress.  Hydroxyzine started   Paresthesias/B12 deficiency B12 is significantly low.  Encouraged her to start sublingual B12 1000 mcg daily. This will help with the paresthesias and help with her chronic headaches as well. B12, vitamin D levels collected today Other headache syndrome Suspect her headaches are multifactorial.  She does have some chronic headaches that hopefully will respond well to the B12 supplement.  Also encouraged her to consider low-dose magnesium supplement. Magnesium levels collected today CMP collected today Discussed stress and tension headache with her today and she is  agreeable to start hydroxyzine at bedtime Reviewed expectations re: course of current medical issues. Discussed self-management of symptoms. Outlined signs and symptoms indicating need for more acute intervention. Patient verbalized understanding and all questions were answered. Patient received an After-Visit Summary.    Orders Placed This Encounter  Procedures   Comp Met (CMET)   B12   Vitamin D (25 hydroxy)   Magnesium   SPECIMEN COMPROMISED   Meds ordered this encounter  Medications   hydrOXYzine (ATARAX) 10 MG tablet    Sig: Take 1-3 tablets (10-30 mg total) by mouth at bedtime as needed.    Dispense:  30 tablet    Refill:  0   Referral Orders  No referral(s) requested today     Note is dictated utilizing voice recognition software. Although note has been proof read prior to signing, occasional typographical errors still can be missed. If any questions arise, please do not hesitate to call for verification.   electronically signed by:  Howard Pouch, DO  [place]

## 2022-04-13 LAB — SPECIMEN COMPROMISED

## 2022-04-13 LAB — COMPREHENSIVE METABOLIC PANEL
AG Ratio: 1.4 (calc) (ref 1.0–2.5)
ALT: 9 U/L (ref 6–29)
AST: 13 U/L (ref 10–35)
Albumin: 4.1 g/dL (ref 3.6–5.1)
Alkaline phosphatase (APISO): 44 U/L (ref 31–125)
BUN: 14 mg/dL (ref 7–25)
CO2: 25 mmol/L (ref 20–32)
Calcium: 9.2 mg/dL (ref 8.6–10.2)
Chloride: 106 mmol/L (ref 98–110)
Creat: 0.72 mg/dL (ref 0.50–0.99)
Globulin: 2.9 g/dL (calc) (ref 1.9–3.7)
Glucose, Bld: 79 mg/dL (ref 65–99)
Potassium: 4.2 mmol/L (ref 3.5–5.3)
Sodium: 139 mmol/L (ref 135–146)
Total Bilirubin: 0.4 mg/dL (ref 0.2–1.2)
Total Protein: 7 g/dL (ref 6.1–8.1)

## 2022-04-13 LAB — VITAMIN B12: Vitamin B-12: 276 pg/mL (ref 200–1100)

## 2022-04-13 LAB — MAGNESIUM: Magnesium: 2 mg/dL (ref 1.5–2.5)

## 2022-04-13 LAB — VITAMIN D 25 HYDROXY (VIT D DEFICIENCY, FRACTURES): Vit D, 25-Hydroxy: 53 ng/mL (ref 30–100)

## 2022-04-15 ENCOUNTER — Telehealth: Payer: Self-pay | Admitting: Family Medicine

## 2022-04-15 NOTE — Telephone Encounter (Signed)
Please call patient Liver, kidney and electrolytes are normal. Vitamin D looks good at 53.   B12 is has not significantly increased by the supplement.  I would encourage her to start B12 injections every 2 weeks by nurse visit for 6 doses and then follow-up with provider.

## 2022-04-15 NOTE — Telephone Encounter (Signed)
Spoke with pt regarding labs and instructions.   

## 2022-04-18 ENCOUNTER — Ambulatory Visit (INDEPENDENT_AMBULATORY_CARE_PROVIDER_SITE_OTHER): Payer: BC Managed Care – PPO

## 2022-04-18 DIAGNOSIS — E538 Deficiency of other specified B group vitamins: Secondary | ICD-10-CM | POA: Diagnosis not present

## 2022-04-18 MED ORDER — CYANOCOBALAMIN 1000 MCG/ML IJ SOLN
1000.0000 ug | INTRAMUSCULAR | Status: AC
  Administered 2022-04-18 – 2022-06-14 (×5): 1000 ug via INTRAMUSCULAR

## 2022-04-18 NOTE — Progress Notes (Signed)
Pt here for monthly B12 injection per [facility]  B12 1015mg given IM, and pt tolerated injection well.  Next B12 injection scheduled for Q 2 weeks

## 2022-04-25 ENCOUNTER — Telehealth: Payer: Self-pay | Admitting: Family Medicine

## 2022-04-25 DIAGNOSIS — R42 Dizziness and giddiness: Secondary | ICD-10-CM

## 2022-04-25 NOTE — Telephone Encounter (Signed)
Please advise if referral is appropriate

## 2022-04-25 NOTE — Telephone Encounter (Signed)
Pt has the fax information for the ENT referral she would like to see. Dr. Redmond Baseman and the fax number to send the referral to is [phone]

## 2022-04-26 NOTE — Telephone Encounter (Signed)
Referral placed.

## 2022-04-26 NOTE — Addendum Note (Signed)
Addended by: Kavin Leech on: 04/26/2022 01:59 PM   Modules accepted: Orders

## 2022-04-26 NOTE — Telephone Encounter (Signed)
Okay to place referral on the vertigo

## 2022-05-03 ENCOUNTER — Ambulatory Visit (INDEPENDENT_AMBULATORY_CARE_PROVIDER_SITE_OTHER): Payer: BC Managed Care – PPO

## 2022-05-03 DIAGNOSIS — E538 Deficiency of other specified B group vitamins: Secondary | ICD-10-CM | POA: Diagnosis not present

## 2022-05-03 NOTE — Progress Notes (Signed)
Pt here for monthly B12 injection per [facility]   B12 1054mg given IM, and pt tolerated injection well.   Next B12 injection scheduled for Q 2 weeks

## 2022-05-17 ENCOUNTER — Ambulatory Visit (INDEPENDENT_AMBULATORY_CARE_PROVIDER_SITE_OTHER): Payer: BC Managed Care – PPO

## 2022-05-17 DIAGNOSIS — E538 Deficiency of other specified B group vitamins: Secondary | ICD-10-CM | POA: Diagnosis not present

## 2022-05-17 NOTE — Progress Notes (Signed)
Pt here for monthly B12 injection per [facility]   B12 1083mg given IM, and pt tolerated injection well.   Next B12 injection scheduled for Q 2 weeks  #3/6

## 2022-05-31 ENCOUNTER — Ambulatory Visit (INDEPENDENT_AMBULATORY_CARE_PROVIDER_SITE_OTHER): Payer: BC Managed Care – PPO

## 2022-05-31 DIAGNOSIS — E538 Deficiency of other specified B group vitamins: Secondary | ICD-10-CM

## 2022-05-31 NOTE — Progress Notes (Signed)
Pt here for monthly B12 injection per [facility]   B12 1096mg given IM, and pt tolerated injection well.   Next B12 injection scheduled for Q 2 weeks   #4/6

## 2022-06-14 ENCOUNTER — Ambulatory Visit (INDEPENDENT_AMBULATORY_CARE_PROVIDER_SITE_OTHER): Payer: BC Managed Care – PPO

## 2022-06-14 DIAGNOSIS — E538 Deficiency of other specified B group vitamins: Secondary | ICD-10-CM

## 2022-06-14 NOTE — Progress Notes (Unsigned)
Pt here for monthly B12 injection per [facility]   B12 1023mg given IM, and pt tolerated injection well.   Next B12 injection scheduled for Q 2 weeks   #5/6

## 2022-06-28 ENCOUNTER — Ambulatory Visit: Payer: BC Managed Care – PPO

## 2022-07-09 ENCOUNTER — Telehealth: Payer: Self-pay

## 2022-07-09 NOTE — Telephone Encounter (Signed)
Patient called into the office today requesting to have her b12 levels checked prior to provider appointment on 11/28 so that labs could be discussed during appointment. She can come in any day this week for this. She was advised again of provider recommendations: "B12 injections every 2 weeks by nurse visit for 6 doses and then follow-up with provider ". The provider would be notified but not a guarantee this could be approved. She will call back later today to confirm if this message was sent to PCP.   Please further advise.

## 2022-07-09 NOTE — Telephone Encounter (Signed)
No further action needed.

## 2022-07-09 NOTE — Telephone Encounter (Signed)
Labs at appt is needed.

## 2022-07-10 ENCOUNTER — Encounter: Payer: BC Managed Care – PPO | Admitting: Family Medicine

## 2022-07-16 ENCOUNTER — Ambulatory Visit: Payer: BC Managed Care – PPO | Admitting: Family Medicine

## 2022-07-16 ENCOUNTER — Encounter: Payer: Self-pay | Admitting: Family Medicine

## 2022-07-16 VITALS — BP 129/77 | HR 85 | Temp 98.2°F | Wt 171.0 lb

## 2022-07-16 DIAGNOSIS — E538 Deficiency of other specified B group vitamins: Secondary | ICD-10-CM | POA: Diagnosis not present

## 2022-07-16 DIAGNOSIS — R202 Paresthesia of skin: Secondary | ICD-10-CM | POA: Diagnosis not present

## 2022-07-16 LAB — VITAMIN B12: Vitamin B-12: 457 pg/mL (ref 211–911)

## 2022-07-16 NOTE — Progress Notes (Signed)
Kristina Pierce , Nov 10, 1975, 46 y.o., female MRN: [id number] Patient Care Team    Relationship Specialty Notifications Start End  [name], DO PCP - General Family Medicine  12/02/19   [name], MD Consulting Physician Urology  08/14/16   [name], MD Consulting Physician Obstetrics and Gynecology  04/30/18   [name], MD Consulting Physician Gastroenterology  07/09/21     Chief Complaint  Patient presents with   b12 deficiency     Subjective: Kristina Pierce is a 46 y.o. female presents today to follow-up on paresthesias, headaches, visual disturbances and newfound B12 deficiency.  She reports she has had injections every 2 weeks x 6.  Last injection was about 2.5 weeks ago.  She has noticed an improvement with decreased headaches, dizziness and paresthesias.  She is also established with a retinal therapist from [place], who is working with her with light therapy to help with the visual disturbances.  Prior note: Vision improved, less headeaches, more consistent memory Saw neurophth> disconnect with light and completing exercisese Pt presents for an OV with  multiple complaints she is unsure if are related.  Patient reports she has had an earache that started last weekend, currently on day 6.  She felt her ear felt full if she took a Zyrtec x1.  Then this past Tuesday she felt pressure or pain in her ear and she was seen at the urgent care.  She was told she did not have an ear infection but they gave her meclizine and amoxicillin. Sunday she had vertigo.  She had been for this issue just a few weeks ago (see below).  She states it is now not just when she is driving but can happen at any time.  She has noticed increased over the last few days occurring up to 4-5 times a day.  She had a headache.  She desc.ribes as tension headache can last a few minutes up to an hour.  She feels her tongue is more thick, and that is causing her to have difficulty  swallowing.  She has started the B12 and it did help with her energy.  She denies fevers or chills.  She denies acute illness.  She endorses increased rest. Prior note: Pt presents for an OV with complaints of dizziness.  She has been seen in the ED early July and by neurology earlier today.  ED felt she was having cluster headaches and neurology felt she is having atypical migraines. She was noted to have low B12 on labs, she has not started B12 supplement as of yet.  12 levels were 224.  CMP was normal.  CBC with differential normal.  Thyroid was normal.  MRI of brain without contrast did not show any acute changes to explain dizziness. In this course, she was treated with Augmentin and Flonase as well.   Per patient today she has noticed the dizziness mostly when going over or passes.  She states she does have a fear of heights, but this has never bothered her before to go over an overpass.  It is only when she is driving and not as a passenger.  This sensation is not so much dizziness as a room bending but more of a movement sensation. The day she had the worst headache of her life in the evening, she was woken at 2 AM with a frontal headache.  She states that day she was in the sun swimming all day with her  children.  When she woke up at 2 AM she took an Excedrin and placed a cool cloth across her forehead.  She states it seemed to help but she then could not go to sleep.  Since she could not sleep she started to worry something was wrong with her and it caused her increased anxiety and stress.  She then got up at 4 AM and realized her heart rate was 150/100 so she called EMS to be checked and blood pressure was about the same and heart rate was 90.     07/16/2022   10:58 AM 07/09/2021   10:29 AM 04/30/2018    9:10 AM 08/14/2016    8:20 AM  Depression screen PHQ 2/9  Decreased Interest 0 0 0 0  Down, Depressed, Hopeless 0 0 0 0  PHQ - 2 Score 0 0 0 0    Allergies  Allergen Reactions   Goat  Milk Hives    SOB   Goat-Derived Products Hives   Chlorhexidine Itching and Rash   Social History   Social History Narrative   Kristina Pierce lives with her daughter Kristina Pierce). She is a widow.    She works Medical laboratory scientific officer at [employer], Research scientist (medical).   Masters degree.   Drinks caffeine, takes a daily vitamin.   Wear seatbelt, wears a bicycle helmet, smoke detector at home.   Feels safe in her relationships.   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 Berenice Primas. Tried systemic prednisone-unable to tolerate SE-HA, dizzy. Discussed steroid injection.   Neck pain 07/18/2020   Neuropathy    Pain in joint of right shoulder 07/18/2020   Paresthesia of both hands 11/30/2013   Duration few years, intermittent. More intense last 14 days.   Paresthesia of both lower extremities 11/30/2013   Duration 6 years. Intermittent. Persistent in last 14 days. Reviewed Neuro notes 12/23/13: Dr Vallarie Mare [facility]: small fiber neuropathy is likely, large fiber not r/o, amyloidosis is possiblity. Labs & NCV/EMG tests pending   Radiculopathy of cervical spine    Urine incontinence    Past Surgical History:  Procedure Laterality Date   APPENDECTOMY  2012   CERVICAL SPINE SURGERY  07/18/2020   kidney stone removal  2011   TONSILLECTOMY  1996   WISDOM TOOTH EXTRACTION     Family History  Problem Relation Age of Onset   Dystonia Mother        cervical dystonia from trauma   Kidney disease Mother        stones   Breast cancer Mother    Arthritis Mother    Hypertension Father    Hyperlipidemia Father    Other Father        amyloidosis   Hyperlipidemia Sister    Kidney disease Sister        stones   Other Sister        hearing loss, etio?   ALS Paternal Uncle    Multiple sclerosis Paternal Uncle    Diabetes Paternal Uncle    Kidney  cancer Maternal Grandfather    Heart disease Paternal Grandmother    Heart disease Paternal Grandfather    Colon cancer Maternal Grandmother    Esophageal cancer Neg Hx    Rectal cancer Neg Hx    Stomach  cancer Neg Hx    Allergies as of 07/16/2022       Reactions   Goat Milk Hives   SOB   Goat-derived Products Hives   Chlorhexidine Itching, Rash        Medication List        Accurate as of July 16, 2022 12:58 PM. If you have any questions, ask your nurse or doctor.          STOP taking these medications    hydrOXYzine 10 MG tablet Commonly known as: ATARAX Stopped by: Howard Pouch, DO       TAKE these medications    albuterol 108 (90 Base) MCG/ACT inhaler Commonly known as: VENTOLIN HFA Inhale 2 puffs into the lungs every 6 (six) hours as needed for wheezing or shortness of breath.   cyanocobalamin 1000 MCG tablet Take 1,000 mcg by mouth daily.   famotidine 20 MG tablet Commonly known as: PEPCID Take 1 tablet (20 mg total) by mouth 2 (two) times daily.   Fish Oil 1000 MG Caps Take by mouth at bedtime.   fluticasone 50 MCG/ACT nasal spray Commonly known as: FLONASE Place 2 sprays into both nostrils daily.   Junel FE 1/20 1-20 MG-MCG tablet Generic drug: norethindrone-ethinyl estradiol-FE Take 1 tablet by mouth daily.   multivitamin tablet Take 1 tablet by mouth daily.        All past medical history, surgical history, allergies, family history, immunizations andmedications were updated in the EMR today and reviewed under the history and medication portions of their EMR.     ROS Negative, with the exception of above mentioned in HPI   Objective:  BP 129/77   Pulse 85   Temp 98.2 F (36.8 C)   Wt 171 lb (77.6 kg)   SpO2 98%   BMI 28.46 kg/m  Body mass index is 28.46 kg/m. Physical Exam Vitals and nursing note reviewed.  Constitutional:      General: She is not in acute distress.    Appearance: Normal appearance. She is normal  weight. She is not ill-appearing or toxic-appearing.  Eyes:     Extraocular Movements: Extraocular movements intact.     Conjunctiva/sclera: Conjunctivae normal.     Pupils: Pupils are equal, round, and reactive to light.  Neurological:     Mental Status: She is alert and oriented to person, place, and time. Mental status is at baseline.  Psychiatric:        Mood and Affect: Mood normal.        Behavior: Behavior normal.        Thought Content: Thought content normal.        Judgment: Judgment normal.      No results found. No results found. No results found for this or any previous visit (from the past 24 hour(s)).  Assessment/Plan: Kristina Pierce is a 46 y.o. female present for OV for  Dizziness /vertigo/headaches seen improvement since starting B12 injections Working with retinal specialist to help with visual disturbances.  She is performing light therapy exercises at home health.  Paresthesias/B12 deficiency Paresthesias have lessened. She feels the B12 is effective and helping. B12 levels collected today. B12 injection administered after labs today. Discussed she may need to continue B12 injections every 2 weeks, will be able to guide her better once results received.  Reviewed expectations re: course of current medical issues. Discussed self-management of symptoms. Outlined signs and symptoms indicating need for more acute intervention. Patient verbalized understanding and all questions were  answered. Patient received an After-Visit Summary.    Orders Placed This Encounter  Procedures   B12   No orders of the defined types were placed in this encounter.  Referral Orders  No referral(s) requested today     Note is dictated utilizing voice recognition software. Although note has been proof read prior to signing, occasional typographical errors still can be missed. If any questions arise, please do not hesitate to call for verification.   electronically  signed by:  Howard Pouch, DO  [place]

## 2022-07-17 ENCOUNTER — Telehealth: Payer: Self-pay | Admitting: Family Medicine

## 2022-07-17 NOTE — Telephone Encounter (Signed)
Spoke with patient regarding results/recommendations.  

## 2022-07-17 NOTE — Telephone Encounter (Signed)
Please inform patient her B12 is improving and was 457.  I would recommend she continue the B12 injections every 4 weeks by nurse visit to maintain levels.     -I would recommend she also start 1000 mcg sublingual solution B12 that is held underneath the tongue for 30 seconds.  Sublingual to be taken daily, along with the B12 injections every 4 weeks.  Like to try to keep her levels above 500 at all times if able.

## 2022-08-16 ENCOUNTER — Ambulatory Visit (INDEPENDENT_AMBULATORY_CARE_PROVIDER_SITE_OTHER): Payer: BC Managed Care – PPO

## 2022-08-16 DIAGNOSIS — E538 Deficiency of other specified B group vitamins: Secondary | ICD-10-CM | POA: Diagnosis not present

## 2022-08-16 MED ORDER — CYANOCOBALAMIN 1000 MCG/ML IJ SOLN
1000.0000 ug | Freq: Once | INTRAMUSCULAR | Status: AC
  Administered 2022-08-16: 1000 ug via INTRAMUSCULAR

## 2022-08-16 NOTE — Progress Notes (Signed)
Pt here today for monthly B12 shot. Shot was given with no issue. Rx provided by office.    

## 2022-09-17 ENCOUNTER — Ambulatory Visit (INDEPENDENT_AMBULATORY_CARE_PROVIDER_SITE_OTHER): Payer: BC Managed Care – PPO

## 2022-09-17 DIAGNOSIS — E538 Deficiency of other specified B group vitamins: Secondary | ICD-10-CM

## 2022-09-17 MED ORDER — CYANOCOBALAMIN 1000 MCG/ML IJ SOLN
1000.0000 ug | INTRAMUSCULAR | Status: AC
  Administered 2022-09-17 – 2023-07-31 (×9): 1000 ug via INTRAMUSCULAR

## 2022-09-17 NOTE — Progress Notes (Signed)
Pt here today for monthly B12 shot. Shot was given with no issue. Rx provided by office.    

## 2022-10-18 ENCOUNTER — Ambulatory Visit (INDEPENDENT_AMBULATORY_CARE_PROVIDER_SITE_OTHER): Payer: BC Managed Care – PPO

## 2022-10-18 DIAGNOSIS — E538 Deficiency of other specified B group vitamins: Secondary | ICD-10-CM | POA: Diagnosis not present

## 2022-10-21 NOTE — Progress Notes (Signed)
Pt here today for monthly B12 shot. Shot was given with no issue. Rx provided by office.

## 2022-11-18 ENCOUNTER — Ambulatory Visit (INDEPENDENT_AMBULATORY_CARE_PROVIDER_SITE_OTHER): Payer: BC Managed Care – PPO

## 2022-11-18 DIAGNOSIS — E538 Deficiency of other specified B group vitamins: Secondary | ICD-10-CM | POA: Diagnosis not present

## 2022-11-18 NOTE — Progress Notes (Signed)
Pt here for monthly B12 injection per Dr.Kuneff  B12 1000mcg given IM, and pt tolerated injection well.  Next B12 injection scheduled for 1 month.   Please sign off in PCP absence.  

## 2022-12-19 ENCOUNTER — Ambulatory Visit (INDEPENDENT_AMBULATORY_CARE_PROVIDER_SITE_OTHER): Payer: BC Managed Care – PPO

## 2022-12-19 DIAGNOSIS — E538 Deficiency of other specified B group vitamins: Secondary | ICD-10-CM

## 2022-12-19 NOTE — Progress Notes (Signed)
Pt here for monthly B12 injection per   B12 given IM, and pt tolerated injection well.  Next B12 injection scheduled for 1 month from today.

## 2023-01-20 ENCOUNTER — Ambulatory Visit (INDEPENDENT_AMBULATORY_CARE_PROVIDER_SITE_OTHER): Payer: BC Managed Care – PPO

## 2023-01-20 DIAGNOSIS — E538 Deficiency of other specified B group vitamins: Secondary | ICD-10-CM | POA: Diagnosis not present

## 2023-01-20 NOTE — Progress Notes (Signed)
Pt here for monthly B12 injection per   B12 1000mcg given IM, and pt tolerated injection well.  Next B12 injection scheduled for 1 month from today. 

## 2023-02-19 ENCOUNTER — Ambulatory Visit (INDEPENDENT_AMBULATORY_CARE_PROVIDER_SITE_OTHER): Payer: BC Managed Care – PPO

## 2023-02-19 ENCOUNTER — Ambulatory Visit: Payer: BC Managed Care – PPO

## 2023-02-19 DIAGNOSIS — E538 Deficiency of other specified B group vitamins: Secondary | ICD-10-CM | POA: Diagnosis not present

## 2023-02-19 MED ORDER — CYANOCOBALAMIN 1000 MCG/ML IJ SOLN
1000.0000 ug | Freq: Once | INTRAMUSCULAR | Status: AC
  Administered 2023-02-19: 1000 ug via INTRAMUSCULAR

## 2023-02-19 NOTE — Progress Notes (Signed)
Pt here for monthly B12 injection per Dr. Claiborne Billings.   B12 given IM, and pt tolerated injection well.   Next B12 injection scheduled for 1 month from today.

## 2023-03-04 ENCOUNTER — Ambulatory Visit: Payer: BC Managed Care – PPO | Admitting: Family Medicine

## 2023-03-04 VITALS — BP 138/84 | HR 83 | Wt 172.2 lb

## 2023-03-04 DIAGNOSIS — M79676 Pain in unspecified toe(s): Secondary | ICD-10-CM

## 2023-03-04 DIAGNOSIS — G8929 Other chronic pain: Secondary | ICD-10-CM | POA: Diagnosis not present

## 2023-03-04 NOTE — Progress Notes (Signed)
Kristina Pierce , 1976/07/14, 47 y.o., female MRN: [id number] Patient Care Team    Relationship Specialty Notifications Start End  Natalia Leatherwood, DO PCP - General Family Medicine  12/02/19   Marcine Matar, MD Consulting Physician Urology  08/14/16   Richarda Overlie, MD Consulting Physician Obstetrics and Gynecology  04/30/18   Tressia Danas, MD Consulting Physician Gastroenterology  07/09/21     Chief Complaint  Patient presents with   Toe Pain    Thought it ingrown toe nail went to [facility] 07/08; 2 weeks; starts to tingle up foot; left great toe; using Mupirocin  BID     Subjective: Kristina Pierce is a 47 y.o. Pt presents for an OV with complaints of left toe pain of 10-14d duration. She is concerned she has a toe infection.  She was seen at an [facility] 02/24/2023- provided with Bactroban at that time there was  mild erythema. Patient reports no drainage. Mild tingling at location.     07/16/2022   10:58 AM 07/09/2021   10:29 AM 04/30/2018    9:10 AM 08/14/2016    8:20 AM  Depression screen PHQ 2/9  Decreased Interest 0 0 0 0  Down, Depressed, Hopeless 0 0 0 0  PHQ - 2 Score 0 0 0 0    Allergies  Allergen Reactions   Goat Milk Hives    SOB   Goat-Derived Products Hives   Chlorhexidine Itching and Rash   Social History   Social History Narrative   Ms. Grindle lives with her daughter Maralyn Sago). She is a widow.    She works Teacher, English as a foreign language at [employer], Psychiatrist.   Masters degree.   Drinks caffeine, takes a daily vitamin.   Wear seatbelt, wears a bicycle helmet, smoke detector at home.   Feels safe in her relationships.   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 Luiz Blare. Tried systemic prednisone-unable to tolerate SE-HA, dizzy. Discussed steroid injection.    Neck pain 07/18/2020   Neuropathy    Pain in joint of right shoulder 07/18/2020   Paresthesia of both hands 11/30/2013   Duration few years, intermittent. More intense last 14 days.   Paresthesia of both lower extremities 11/30/2013   Duration 6 years. Intermittent. Persistent in last 14 days. Reviewed Neuro notes 12/23/13: Dr Alphonzo Dublin [facility]: small fiber neuropathy is likely, large fiber not r/o, amyloidosis is possiblity. Labs & NCV/EMG tests pending   Radiculopathy of cervical spine    Urine incontinence    Past Surgical History:  Procedure Laterality Date   APPENDECTOMY  2012   CERVICAL SPINE SURGERY  07/18/2020   kidney stone removal  2011   TONSILLECTOMY  1996   WISDOM TOOTH EXTRACTION     Family History  Problem Relation Age of Onset   Dystonia Mother        cervical dystonia from trauma   Kidney disease Mother        stones   Breast cancer Mother    Arthritis Mother    Hypertension Father    Hyperlipidemia Father    Other Father        amyloidosis   Hyperlipidemia Sister    Kidney disease Sister  stones   Other Sister        hearing loss, etio?   ALS Paternal Uncle    Multiple sclerosis Paternal Uncle    Diabetes Paternal Uncle    Kidney cancer Maternal Grandfather    Heart disease Paternal Grandmother    Heart disease Paternal Grandfather    Colon cancer Maternal Grandmother    Esophageal cancer Neg Hx    Rectal cancer Neg Hx    Stomach cancer Neg Hx    Allergies as of 03/04/2023       Reactions   Goat Milk Hives   SOB   Goat-derived Products Hives   Chlorhexidine Itching, Rash        Medication List        Accurate as of March 04, 2023  2:37 PM. If you have any questions, ask your nurse or doctor.          STOP taking these medications    albuterol 108 (90 Base) MCG/ACT inhaler Commonly known as: VENTOLIN HFA   cyanocobalamin 1000 MCG tablet   famotidine 20 MG tablet Commonly known as: PEPCID       TAKE these medications     Fish Oil 1000 MG Caps Take by mouth at bedtime.   fluticasone 50 MCG/ACT nasal spray Commonly known as: FLONASE Place 2 sprays into both nostrils daily.   Junel FE 1/20 1-20 MG-MCG tablet Generic drug: norethindrone-ethinyl estradiol-FE Take 1 tablet by mouth daily.   multivitamin tablet Take 1 tablet by mouth daily.        All past medical history, surgical history, allergies, family history, immunizations andmedications were updated in the EMR today and reviewed under the history and medication portions of their EMR.     ROS Negative, with the exception of above mentioned in HPI   Objective:  BP 138/84   Pulse 83   Wt 172 lb 3.2 oz (78.1 kg)   LMP 02/16/2023   SpO2 98%   BMI 28.66 kg/m  Body mass index is 28.66 kg/m. Physical Exam Vitals and nursing note reviewed.  Constitutional:      General: She is not in acute distress.    Appearance: Normal appearance. She is normal weight. She is not ill-appearing or toxic-appearing.  HENT:     Head: Normocephalic and atraumatic.  Eyes:     General: No scleral icterus.       Right eye: No discharge.        Left eye: No discharge.     Extraocular Movements: Extraocular movements intact.     Conjunctiva/sclera: Conjunctivae normal.     Pupils: Pupils are equal, round, and reactive to light.  Musculoskeletal:     Comments: Left large toe: no erythema or drainage. No swelling. Medial side of nail lifting. NV intact distally.   Skin:    Findings: No rash.  Neurological:     Mental Status: She is alert and oriented to person, place, and time. Mental status is at baseline.     Motor: No weakness.     Coordination: Coordination normal.     Gait: Gait normal.  Psychiatric:        Mood and Affect: Mood normal.        Behavior: Behavior normal.        Thought Content: Thought content normal.        Judgment: Judgment normal.      No results found. No results found. No results found for this or any previous visit  (  from the past 24 hour(s)).  Assessment/Plan: Kristina Pierce is a 47 y.o. female present for OV for  left large toe pain - does not appear infected today. Currently does not appear ingrown. Suspect pain is from her intervention, attempting to avoid an ingrown nail.  - avoid cutting back edge of nails past nailfold.  - epson salt/warm soaks - BB ointment daily - avoid shoes that cause pressure to this area.  Reviewed expectations re: course of current medical issues. Discussed self-management of symptoms. Outlined signs and symptoms indicating need for more acute intervention. Patient verbalized understanding and all questions were answered. Patient received an After-Visit Summary.    No orders of the defined types were placed in this encounter.  No orders of the defined types were placed in this encounter.  Referral Orders  No referral(s) requested today     Note is dictated utilizing voice recognition software. Although note has been proof read prior to signing, occasional typographical errors still can be missed. If any questions arise, please do not hesitate to call for verification.   electronically signed by:  Felix Pacini, DO  [facility] [place]

## 2023-03-04 NOTE — Patient Instructions (Signed)
No follow-ups on file.        Great to see you today.  I have refilled the medication(s) we provide.   If labs were collected, we will inform you of lab results once received either by echart message or telephone call.   - echart message- for normal results that have been seen by the patient already.   - telephone call: abnormal results or if patient has not viewed results in their echart.  

## 2023-03-20 ENCOUNTER — Ambulatory Visit: Payer: BC Managed Care – PPO | Admitting: Podiatry

## 2023-03-20 ENCOUNTER — Ambulatory Visit (INDEPENDENT_AMBULATORY_CARE_PROVIDER_SITE_OTHER): Payer: BC Managed Care – PPO

## 2023-03-20 DIAGNOSIS — M7752 Other enthesopathy of left foot: Secondary | ICD-10-CM | POA: Diagnosis not present

## 2023-03-20 DIAGNOSIS — L603 Nail dystrophy: Secondary | ICD-10-CM | POA: Diagnosis not present

## 2023-03-20 MED ORDER — CEPHALEXIN 500 MG PO CAPS: 500.0000 mg | ORAL_CAPSULE | Freq: Three times a day (TID) | ORAL | 0 refills | Status: DC

## 2023-03-20 NOTE — Patient Instructions (Addendum)
You can go ahead and start a BIOTIN supplement   Soak Instructions    THE DAY AFTER THE PROCEDURE  Place 1/4 cup of epsom salts in a quart of warm tap water.  Submerge your foot or feet with outer bandage intact for the initial soak; this will allow the bandage to become moist and wet for easy lift off.  Once you remove your bandage, continue to soak in the solution for 20 minutes.  This soak should be done twice a day.  Next, remove your foot or feet from solution, blot dry the affected area and cover.  You may use a band aid large enough to cover the area or use gauze and tape.  Apply other medications to the area as directed by the doctor such as polysporin neosporin.  IF YOUR SKIN BECOMES IRRITATED WHILE USING THESE INSTRUCTIONS, IT IS OKAY TO SWITCH TO  WHITE VINEGAR AND WATER. Or you may use antibacterial soap and water to keep the toe clean  Monitor for any signs/symptoms of infection. Call the office immediately if any occur or go directly to the emergency room. Call with any questions/concerns.

## 2023-03-24 ENCOUNTER — Ambulatory Visit: Payer: BC Managed Care – PPO

## 2023-03-25 ENCOUNTER — Ambulatory Visit (INDEPENDENT_AMBULATORY_CARE_PROVIDER_SITE_OTHER): Payer: BC Managed Care – PPO

## 2023-03-25 DIAGNOSIS — E538 Deficiency of other specified B group vitamins: Secondary | ICD-10-CM

## 2023-03-25 NOTE — Progress Notes (Signed)
Pt here for monthly B12 injection per Dr.Kuneff   B12 1000mcg given IM, and pt tolerated injection well.   Next B12 injection scheduled for 1 month.       

## 2023-03-27 NOTE — Progress Notes (Signed)
Subjective:   Patient ID: Unice Cobble, female   DOB: 47 y.o.   MRN: [id number]   HPI Chief Complaint  Patient presents with   Toe Pain    LEFT HALLUX PAIN,SINCE 02/20/23,  NO INJURY, HAS SEEN URGENT CARE THEY GAVE HER ANTIFUNGAL MEDICINE SHE STOPPED THAT ONE WK AGO, WENT TO PCP 2WKS AGO AND THEY TOLD HER TO SOAK IT IN EPSON SALT BUT THEY TOLD HER IT IS NOT AN INGROWN, STOPPED SOAKING A WK AGO. PAIN LEVEL 3-5 IN A SHOE BEING ACTIVE.   47 year old female presents with above concerns.  No drainage or pus currently.  No injuries.   Review of Systems  All other systems reviewed and are negative.  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 Luiz Blare. Tried systemic prednisone-unable to tolerate SE-HA, dizzy. Discussed steroid injection.   Neck pain 07/18/2020   Neuropathy    Pain in joint of right shoulder 07/18/2020   Paresthesia of both hands 11/30/2013   Duration few years, intermittent. More intense last 14 days.   Paresthesia of both lower extremities 11/30/2013   Duration 6 years. Intermittent. Persistent in last 14 days. Reviewed Neuro notes 12/23/13: Dr Alphonzo Dublin [facility]: small fiber neuropathy is likely, large fiber not r/o, amyloidosis is possiblity. Labs & NCV/EMG tests pending   Radiculopathy of cervical spine    Urine incontinence     Past Surgical History:  Procedure Laterality Date   APPENDECTOMY  2012   CERVICAL SPINE SURGERY  07/18/2020   kidney stone removal  2011   TONSILLECTOMY  1996   WISDOM TOOTH EXTRACTION       Current Outpatient Medications:    cephALEXin (KEFLEX) 500 MG capsule, Take 1 capsule (500 mg total) by mouth 3 (three) times daily., Disp: 21 capsule, Rfl: 0   fluticasone (FLONASE) 50 MCG/ACT nasal spray, Place 2 sprays into both nostrils daily., Disp: 16 g, Rfl: 0   JUNEL FE  1/20 1-20 MG-MCG tablet, Take 1 tablet by mouth daily., Disp: , Rfl:    Multiple Vitamin (MULTIVITAMIN) tablet, Take 1 tablet by mouth daily., Disp: , Rfl:    Omega-3 Fatty Acids (FISH OIL) 1000 MG CAPS, Take by mouth at bedtime., Disp: , Rfl:   Current Facility-Administered Medications:    cyanocobalamin (VITAMIN B12) injection 1,000 mcg, 1,000 mcg, Intramuscular, Q30 days, Kuneff, Renee A, DO, 1,000 mcg at 03/25/23 8295  Allergies  Allergen Reactions   Goat Milk Hives    SOB   Goat-Derived Products Hives   Chlorhexidine Itching and Rash           Objective:  Physical Exam  General: AAO x3, NAD  Dermatological: Hallux nail in the left side is hypertrophic and dystrophic and there is some point discoloration noted.  There is localized edema and erythema along the proximal nail fold.  There is no purulence.  No ascending cellulitis.  Vascular: Dorsalis Pedis artery and Posterior Tibial artery pedal pulses are 2/4 bilateral with immedate capillary fill time. . There is no pain with calf compression, swelling, warmth, erythema.   Neruologic: Grossly intact via light touch bilateral.   Musculoskeletal: No discomfort of the toenail distally.  No other areas of discomfort noted.     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  3 views of the foot were obtained.  No evidence of acute fracture no significant spurring present along the distal phalanx. -Discussed treatment options.  Discussed debridement, conservative care versus nail removal.  Debrided the nail with any complications or bleeding.  Prescribed Keflex.  Recommend Epsom salt soaks daily.  Discussed biotin supplement to help allow the nail to grow.  She can continue antifungal medication as well. -Monitor for any signs or symptoms of infection.  Vivi Barrack DPM

## 2023-04-03 ENCOUNTER — Encounter (INDEPENDENT_AMBULATORY_CARE_PROVIDER_SITE_OTHER): Payer: Self-pay

## 2023-04-23 ENCOUNTER — Ambulatory Visit (INDEPENDENT_AMBULATORY_CARE_PROVIDER_SITE_OTHER): Payer: BC Managed Care – PPO

## 2023-04-23 DIAGNOSIS — E538 Deficiency of other specified B group vitamins: Secondary | ICD-10-CM

## 2023-04-23 MED ORDER — CYANOCOBALAMIN 1000 MCG/ML IJ SOLN
1000.0000 ug | Freq: Once | INTRAMUSCULAR | Status: AC
  Administered 2023-04-23: 1000 ug via INTRAMUSCULAR

## 2023-04-23 NOTE — Progress Notes (Signed)
 Pt here for monthly B12 injection per Dr Claiborne Billings   B12 given IM, and pt tolerated injection well.  Please sign in absence of PCP.

## 2023-05-27 ENCOUNTER — Ambulatory Visit (INDEPENDENT_AMBULATORY_CARE_PROVIDER_SITE_OTHER): Payer: BC Managed Care – PPO

## 2023-05-27 DIAGNOSIS — E538 Deficiency of other specified B group vitamins: Secondary | ICD-10-CM | POA: Diagnosis not present

## 2023-05-27 NOTE — Progress Notes (Signed)
Pt here for monthly B12 injection per Dr.Kuneff   B12 1000mcg given IM, and pt tolerated injection well.   Next B12 injection scheduled for 1 month.       

## 2023-06-27 ENCOUNTER — Ambulatory Visit (INDEPENDENT_AMBULATORY_CARE_PROVIDER_SITE_OTHER): Payer: BC Managed Care – PPO

## 2023-06-27 DIAGNOSIS — E538 Deficiency of other specified B group vitamins: Secondary | ICD-10-CM

## 2023-06-27 NOTE — Progress Notes (Signed)
Pt here for monthly B12 injection per Dr.Kuneff  B12 given IM, and pt tolerated injection well.  Next B12 injection scheduled for 1 month, 07/28/23.

## 2023-07-28 ENCOUNTER — Ambulatory Visit: Payer: BC Managed Care – PPO

## 2023-07-31 ENCOUNTER — Ambulatory Visit (INDEPENDENT_AMBULATORY_CARE_PROVIDER_SITE_OTHER): Payer: BC Managed Care – PPO

## 2023-07-31 DIAGNOSIS — E538 Deficiency of other specified B group vitamins: Secondary | ICD-10-CM | POA: Diagnosis not present

## 2023-07-31 NOTE — Progress Notes (Signed)
Pt here for monthly B12 injection per Dr.Kuneff   B12 1000mcg given IM, and pt tolerated injection well.   Next B12 injection scheduled for 1 month.       

## 2023-08-15 ENCOUNTER — Encounter: Payer: Self-pay | Admitting: Gastroenterology

## 2023-09-04 ENCOUNTER — Ambulatory Visit: Payer: 59

## 2023-09-11 ENCOUNTER — Ambulatory Visit (AMBULATORY_SURGERY_CENTER): Payer: Self-pay | Admitting: *Deleted

## 2023-09-11 VITALS — Ht 65.0 in | Wt 169.0 lb

## 2023-09-11 DIAGNOSIS — Z860100 Personal history of colon polyps, unspecified: Secondary | ICD-10-CM

## 2023-09-11 DIAGNOSIS — Z800 Family history of malignant neoplasm of digestive organs: Secondary | ICD-10-CM

## 2023-09-11 MED ORDER — SUFLAVE 178.7 G PO SOLR: 1.0000 | ORAL | 0 refills | Status: AC

## 2023-09-11 NOTE — Progress Notes (Signed)
Pt's name and DOB verified at the beginning of the pre-visit wit 2 identifiers  Pt denies any difficulty with ambulating,sitting, laying down or rolling side to side  Pt has no issues with ambulation   Pt has no issues moving head neck or swallowing  No egg or soy allergy known to patient   No issues known to pt with past sedation with any surgeries or procedures  Pt denies having issues being intubated  No FH of Malignant Hyperthermia  Pt is not on diet pills or shots  Pt is not on home 02   Pt is not on blood thinners   Pt denies issues with constipation   Pt is not on dialysis  Pt denise any abnormal heart rhythms   Pt denies any upcoming cardiac testing  Pt encouraged to use to use Singlecare or Goodrx to reduce cost   Patient's chart reviewed by Cathlyn Parsons CNRA prior to pre-visit and patient appropriate for the [facility].  Pre-visit completed and red dot placed by patient's name on their procedure day (on provider's schedule).  .  Visit by phone  Pt states weight is 169 lb  Instructed pt why it is important to and  to call if they have any changes in health or new medications. Directed them to the # given and on instructions.     Instructions reviewed. Pt given both [facility] main # and MD on call # prior to instructions.  Pt states understanding. Instructed to review again prior to procedure. Pt states they will.   Informed pt that they will receive a call from [facility] regarding there prep med.

## 2023-09-16 ENCOUNTER — Encounter: Payer: Self-pay | Admitting: Gastroenterology

## 2023-09-19 ENCOUNTER — Telehealth: Payer: Self-pay | Admitting: Gastroenterology

## 2023-09-19 NOTE — Telephone Encounter (Signed)
 ok

## 2023-09-19 NOTE — Telephone Encounter (Signed)
Inbound call from patient stating she starting experiencing cold and flu like symptoms last night. Patient is requesting a call to discuss how to proceed with 2/3 colonoscopy. Please advise, thank you.

## 2023-09-19 NOTE — Telephone Encounter (Signed)
Returned call to patient. She reports having a headache and stuffy nose with some green/yellow drainage. Advised patient to see PCP if she thinks she needs to bee evaluated. Patient agreed to call the after hours number by Sunday if she is not better and need to cancel and reschedule.

## 2023-09-22 ENCOUNTER — Encounter: Payer: Self-pay | Admitting: Gastroenterology

## 2023-09-22 ENCOUNTER — Ambulatory Visit: Payer: 59 | Admitting: Gastroenterology

## 2023-09-22 VITALS — BP 122/64 | HR 63 | Temp 98.0°F | Resp 17 | Ht 65.0 in | Wt 169.0 lb

## 2023-09-22 DIAGNOSIS — Z800 Family history of malignant neoplasm of digestive organs: Secondary | ICD-10-CM

## 2023-09-22 DIAGNOSIS — Z860100 Personal history of colon polyps, unspecified: Secondary | ICD-10-CM

## 2023-09-22 DIAGNOSIS — D123 Benign neoplasm of transverse colon: Secondary | ICD-10-CM

## 2023-09-22 DIAGNOSIS — Z1211 Encounter for screening for malignant neoplasm of colon: Secondary | ICD-10-CM

## 2023-09-22 DIAGNOSIS — K648 Other hemorrhoids: Secondary | ICD-10-CM

## 2023-09-22 DIAGNOSIS — K644 Residual hemorrhoidal skin tags: Secondary | ICD-10-CM | POA: Diagnosis not present

## 2023-09-22 DIAGNOSIS — Z860101 Personal history of adenomatous and serrated colon polyps: Secondary | ICD-10-CM | POA: Diagnosis not present

## 2023-09-22 DIAGNOSIS — D124 Benign neoplasm of descending colon: Secondary | ICD-10-CM

## 2023-09-22 MED ORDER — SODIUM CHLORIDE 0.9 % IV SOLN: 500.0000 mL | INTRAVENOUS | Status: DC

## 2023-09-22 NOTE — Progress Notes (Signed)
 Pt sedate, gd SR's, VSS, report to RN

## 2023-09-22 NOTE — Op Note (Signed)
[facility] Patient Name: Kristina Pierce Procedure Date: 09/22/2023 2:31 PM MRN: [id number] Endoscopist: Napoleon Form , MD, [id number] Age: 48 Referring MD:  Date of Birth: 12/18/1975 Gender: Female Account #: [id number] Procedure:                Colonoscopy Indications:              High risk colon cancer surveillance: Personal                            history of colonic polyps, , Family history of                            advanced adenoma of the colon in a first-degree                            relative before age 31 years, Family history of                            advanced adenomas of the colon in multiple                            first-degree relatives. Genetic cancer syndrome. Medicines:                Monitored Anesthesia Care Procedure:                Pre-Anesthesia Assessment:                           - Prior to the procedure, a History and Physical                            was performed, and patient medications and                            allergies were reviewed. The patient's tolerance of                            previous anesthesia was also reviewed. The risks                            and benefits of the procedure and the sedation                            options and risks were discussed with the patient.                            All questions were answered, and informed consent                            was obtained. Prior Anticoagulants: The patient has                            taken no anticoagulant or antiplatelet agents. ASA  Grade Assessment: II - A patient with mild systemic                            disease. After reviewing the risks and benefits,                            the patient was deemed in satisfactory condition to                            undergo the procedure.                           After obtaining informed consent, the colonoscope                            was passed under  direct vision. Throughout the                            procedure, the patient's blood pressure, pulse, and                            oxygen saturations were monitored continuously. The                            Olympus Scope [id number] was introduced through the                            anus and advanced to the the cecum, identified by                            the appendiceal orifice, ileocecal valve and                            palpation. The colonoscopy was performed without                            difficulty. The patient tolerated the procedure                            well. The quality of the bowel preparation was                            good. The ileocecal valve, appendiceal orifice, and                            rectum were photographed. Scope In: 2:48:33 PM Scope Out: 3:04:50 PM Scope Withdrawal Time: 0 hours 11 minutes 35 seconds  Total Procedure Duration: 0 hours 16 minutes 17 seconds  Findings:                 The perianal and digital rectal examinations were                            normal.  A 1 mm polyp was found in the transverse colon. The                            polyp was sessile. The polyp was removed with a                            cold biopsy forceps. Resection and retrieval were                            complete.                           A 4 mm polyp was found in the descending colon. The                            polyp was sessile. The polyp was removed with a                            cold snare. Resection and retrieval were complete.                           Non-bleeding external and internal hemorrhoids were                            found during retroflexion. The hemorrhoids were                            medium-sized. Complications:            No immediate complications. Estimated Blood Loss:     Estimated blood loss was minimal. Impression:               - One 1 mm polyp in the transverse colon, removed                             with a cold biopsy forceps. Resected and retrieved.                           - One 4 mm polyp in the descending colon, removed                            with a cold snare. Resected and retrieved.                           - Non-bleeding external and internal hemorrhoids. Recommendation:           - Patient has a contact number available for                            emergencies. The signs and symptoms of potential                            delayed complications were discussed with the  patient. Return to normal activities tomorrow.                            Written discharge instructions were provided to the                            patient.                           - Resume previous diet.                           - Continue present medications.                           - Await pathology results.                           - Repeat colonoscopy in 5 years for surveillance                            based on pathology results. Napoleon Form, MD 09/22/2023 3:18:30 PM This report has been signed electronically.

## 2023-09-22 NOTE — Progress Notes (Signed)
 Called to room to assist during endoscopic procedure.  Patient ID and intended procedure confirmed with present staff. Received instructions for my participation in the procedure from the performing physician.

## 2023-09-22 NOTE — Patient Instructions (Signed)
Thank you for letting us care for your healthcare needs! Please see handouts regarding Polyps & Hemorrhoids.  YOU HAD AN ENDOSCOPIC PROCEDURE TODAY AT THE [facility]:   Refer to the procedure report that was given to you for any specific questions about what was found during the examination.  If the procedure report does not answer your questions, please call your gastroenterologist to clarify.  If you requested that your care partner not be given the details of your procedure findings, then the procedure report has been included in a sealed envelope for you to review at your convenience later.  YOU SHOULD EXPECT: Some feelings of bloating in the abdomen. Passage of more gas than usual.  Walking can help get rid of the air that was put into your GI tract during the procedure and reduce the bloating. If you had a lower endoscopy (such as a colonoscopy or flexible sigmoidoscopy) you may notice spotting of blood in your stool or on the toilet paper. If you underwent a bowel prep for your procedure, you may not have a normal bowel movement for a few days.  Please Note:  You might notice some irritation and congestion in your nose or some drainage.  This is from the oxygen used during your procedure.  There is no need for concern and it should clear up in a day or so.  SYMPTOMS TO REPORT IMMEDIATELY:  Following lower endoscopy (colonoscopy or flexible sigmoidoscopy):  Excessive amounts of blood in the stool  Significant tenderness or worsening of abdominal pains  Swelling of the abdomen that is new, acute  Fever of 100F or higher  For urgent or emergent issues, a gastroenterologist can be reached at any hour by calling [phone]. Do not use MyChart messaging for urgent concerns.    DIET:  We do recommend a small meal at first, but then you may proceed to your regular diet.  Drink plenty of fluids but you should avoid alcoholic beverages for 24 hours.  ACTIVITY:  You should plan  to take it easy for the rest of today and you should NOT DRIVE or use heavy machinery until tomorrow (because of the sedation medicines used during the test).    FOLLOW UP: Our staff will call the number listed on your records the next business day following your procedure.  We will call around 7:15- 8:00 am to check on you and address any questions or concerns that you may have regarding the information given to you following your procedure. If we do not reach you, we will leave a message.     If any biopsies were taken you will be contacted by phone or by letter within the next 1-3 weeks.  Please call us at [phone] if you have not heard about the biopsies in 3 weeks.    SIGNATURES/CONFIDENTIALITY: You and/or your care partner have signed paperwork which will be entered into your electronic medical record.  These signatures attest to the fact that that the information above on your After Visit Summary has been reviewed and is understood.  Full responsibility of the confidentiality of this discharge information lies with you and/or your care-partner.

## 2023-09-22 NOTE — Progress Notes (Signed)
Patient states there have been no changes to medical or surgical history since time of pre-visit. 

## 2023-09-22 NOTE — Progress Notes (Signed)
[facility] History and Physical   Primary Care Physician:  Natalia Leatherwood, DO   Reason for Procedure:  History of adenomatous colon polyps  Plan:    Surveillance colonoscopy with possible interventions as needed     HPI: Kristina Pierce is a very pleasant 48 y.o. female here for surveillance colonoscopy. Denies any nausea, vomiting, abdominal pain, melena or bright red blood per rectum  The risks and benefits as well as alternatives of endoscopic procedure(s) have been discussed and reviewed. All questions answered. The patient agrees to proceed.    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 Luiz Blare. Tried systemic prednisone-unable to tolerate SE-HA, dizzy. Discussed steroid injection.   Neck pain 07/18/2020   Neuropathy    Pain in joint of right shoulder 07/18/2020   Paresthesia of both hands 11/30/2013   Duration few years, intermittent. More intense last 14 days.   Paresthesia of both lower extremities 11/30/2013   Duration 6 years. Intermittent. Persistent in last 14 days. Reviewed Neuro notes 12/23/13: Dr Alphonzo Dublin [facility]: small fiber neuropathy is likely, large fiber not r/o, amyloidosis is possiblity. Labs & NCV/EMG tests pending   Radiculopathy of cervical spine    Urine incontinence     Past Surgical History:  Procedure Laterality Date   APPENDECTOMY  2012   CERVICAL SPINE SURGERY  07/18/2020   COLONOSCOPY     kidney stone removal  2011   TONSILLECTOMY  1996   WISDOM TOOTH EXTRACTION      Prior to Admission medications   Medication Sig Start Date End Date Taking? Authorizing Provider  Cholecalciferol (D3 5000) 125 MCG (5000 UT) capsule Take 5,000 Units by mouth daily.   Yes [provider]  Cyanocobalamin (B12 LIQUID HEALTH BOOSTER PO) Take by  mouth.   Yes [provider]  JUNEL FE 1/20 1-20 MG-MCG tablet Take 1 tablet by mouth daily. 09/19/19  Yes [provider]  PEG 3350-KCl-NaCl-NaSulf-MgSul (SUFLAVE) 178.7 g SOLR Take 1 kit by mouth as directed. 09/11/23  Yes Carter Kaman, Eleonore Chiquito, MD  Cyanocobalamin (B-12 COMPLIANCE INJECTION IJ) Inject as directed. Once  month    [provider]  fluticasone (FLONASE) 50 MCG/ACT nasal spray Place 2 sprays into both nostrils daily. 02/25/22   Margaretann Loveless, PA-C  Multiple Vitamin (MULTIVITAMIN) tablet Take 1 tablet by mouth daily.    [provider]  Omega-3 Fatty Acids (FISH OIL) 1000 MG CAPS Take by mouth at bedtime.    [provider]    Current Outpatient Medications  Medication Sig Dispense Refill   Cholecalciferol (D3 5000) 125 MCG (5000 UT) capsule Take 5,000 Units by mouth daily.     Cyanocobalamin (B12 LIQUID HEALTH BOOSTER PO) Take by mouth.     JUNEL FE 1/20 1-20 MG-MCG tablet Take 1 tablet by mouth daily.     PEG 3350-KCl-NaCl-NaSulf-MgSul (SUFLAVE) 178.7 g SOLR Take 1 kit by mouth as directed. 1 each 0   Cyanocobalamin (B-12 COMPLIANCE INJECTION IJ) Inject as directed. Once  month     fluticasone (FLONASE) 50 MCG/ACT nasal spray Place 2 sprays into both nostrils daily. 16 g 0   Multiple Vitamin (MULTIVITAMIN) tablet Take 1 tablet by mouth daily.     Omega-3 Fatty Acids (FISH OIL)  1000 MG CAPS Take by mouth at bedtime.     Current Facility-Administered Medications  Medication Dose Route Frequency Provider Last Rate Last Admin   0.9 %  sodium chloride infusion  500 mL Intravenous Continuous Taquan Bralley, Eleonore Chiquito, MD        Allergies as of 09/22/2023 - Review Complete 09/22/2023  Allergen Reaction Noted   Goat milk Hives 03/16/2012   Goat-derived products Hives 03/16/2012   Chlorhexidine Itching and Rash 12/11/2011    Family History  Problem Relation Age of Onset   Dystonia Mother        cervical dystonia from trauma   Kidney  disease Mother        stones   Breast cancer Mother    Arthritis Mother    Hypertension Father    Hyperlipidemia Father    Other Father        amyloidosis   Hyperlipidemia Sister    Kidney disease Sister        stones   Other Sister        hearing loss, etio?   ALS Paternal Uncle    Multiple sclerosis Paternal Uncle    Diabetes Paternal Uncle    Colon cancer Maternal Grandmother    Kidney cancer Maternal Grandfather    Heart disease Paternal Grandmother    Heart disease Paternal Grandfather    Esophageal cancer Neg Hx    Rectal cancer Neg Hx    Stomach cancer Neg Hx    Colon polyps Neg Hx     Social History   Socioeconomic History   Marital status: Widowed    Spouse name: Not on file   Number of children: 2   Years of education: Master   Highest education level: Not on file  Occupational History   Occupation: Magazine features editor: [employer]    Comment: HS chemistry  Tobacco Use   Smoking status: Never   Smokeless tobacco: Never  Vaping Use   Vaping status: Never Used  Substance and Sexual Activity   Alcohol use: No   Drug use: No   Sexual activity: Yes    Partners: Male    Birth control/protection: Pill    Comment: married  Other Topics Concern   Not on file  Social History Narrative   Ms. Karis lives with her daughter Maralyn Sago). She is a widow.    She works Teacher, English as a foreign language at [employer], Psychiatrist.   Masters degree.   Drinks caffeine, takes a daily vitamin.   Wear seatbelt, wears a bicycle helmet, smoke detector at home.   Feels safe in her relationships.   Social Drivers of Corporate investment banker Strain: Not on file  Food Insecurity: Not on file  Transportation Needs: Not on file  Physical Activity: Not on file  Stress: Not on file  Social Connections: Not on file  Intimate Partner Violence: Not on file    Review of Systems:  All other review of systems negative except as mentioned in the HPI.  Physical Exam: Vital signs in  last 24 hours: BP (!) 140/91   Pulse 84   Temp 98 F (36.7 C) (Skin)   Resp 16   Ht 5\' 5"  (1.651 m)   Wt 169 lb (76.7 kg)   SpO2 99%   BMI 28.12 kg/m  General:   Alert, NAD Lungs:  Clear .   Heart:  Regular rate and rhythm Abdomen:  Soft, nontender and nondistended. Neuro/Psych:  Alert and cooperative. Normal mood  and affect. A and O x 3  Reviewed labs, radiology imaging, old records and pertinent past GI work up  Patient is appropriate for planned procedure(s) and anesthesia in an ambulatory setting   K. Scherry Ran , MD [phone]

## 2023-09-23 ENCOUNTER — Telehealth: Payer: Self-pay | Admitting: *Deleted

## 2023-09-23 NOTE — Telephone Encounter (Signed)
  Follow up Call-     09/22/2023    2:07 PM  Call back number  Post procedure Call Back phone  # [phone]  Permission to leave phone message Yes     Patient questions:  Do you have a fever, pain , or abdominal swelling? No. Pain Score  0 *  Have you tolerated food without any problems? No.  Have you been able to return to your normal activities? No.  Do you have any questions about your discharge instructions: Diet   Yes.   Medications  Yes.   Follow up visit  Yes.    Do you have questions or concerns about your Care? No.  Actions: * If pain score is 4 or above: No action needed, pain <4.

## 2023-09-25 LAB — SURGICAL PATHOLOGY

## 2023-10-06 ENCOUNTER — Ambulatory Visit (INDEPENDENT_AMBULATORY_CARE_PROVIDER_SITE_OTHER): Payer: 59

## 2023-10-06 DIAGNOSIS — E538 Deficiency of other specified B group vitamins: Secondary | ICD-10-CM | POA: Diagnosis not present

## 2023-10-06 MED ORDER — CYANOCOBALAMIN 1000 MCG/ML IJ SOLN
1000.0000 ug | Freq: Once | INTRAMUSCULAR | Status: AC
Start: 2023-10-06 — End: 2023-10-06
  Administered 2023-10-06: 1000 ug via INTRAMUSCULAR

## 2023-10-06 NOTE — Progress Notes (Signed)
Pt here for monthly B12 injection per [facility]  B12 given IM, and pt tolerated injection well.  Next B12 injection scheduled for 1 month

## 2023-10-28 ENCOUNTER — Encounter: Payer: Self-pay | Admitting: Gastroenterology

## 2023-11-03 ENCOUNTER — Ambulatory Visit (INDEPENDENT_AMBULATORY_CARE_PROVIDER_SITE_OTHER): Payer: 59

## 2023-11-03 DIAGNOSIS — E538 Deficiency of other specified B group vitamins: Secondary | ICD-10-CM | POA: Diagnosis not present

## 2023-11-03 MED ORDER — CYANOCOBALAMIN 1000 MCG/ML IJ SOLN
1000.0000 ug | Freq: Once | INTRAMUSCULAR | Status: AC
  Administered 2023-11-03: 1000 ug via INTRAMUSCULAR

## 2023-11-03 NOTE — Progress Notes (Signed)
Pt here for monthly B12 injection per Dr.Kuneff   B12 1000mcg given IM, and pt tolerated injection well.   Next B12 injection scheduled for 1 month.       

## 2023-12-01 ENCOUNTER — Ambulatory Visit (INDEPENDENT_AMBULATORY_CARE_PROVIDER_SITE_OTHER)

## 2023-12-01 DIAGNOSIS — E538 Deficiency of other specified B group vitamins: Secondary | ICD-10-CM | POA: Diagnosis not present

## 2023-12-01 MED ORDER — CYANOCOBALAMIN 1000 MCG/ML IJ SOLN
1000.0000 ug | Freq: Once | INTRAMUSCULAR | Status: AC
Start: 2023-12-01 — End: 2023-12-01
  Administered 2023-12-01: 1000 ug via INTRAMUSCULAR

## 2023-12-01 NOTE — Progress Notes (Signed)
Pt here for monthly B12 injection per Dr.Kuneff   B12 1000mcg given IM, and pt tolerated injection well.   Next B12 injection scheduled for 1 month.       

## 2023-12-24 ENCOUNTER — Encounter (HOSPITAL_COMMUNITY): Payer: Self-pay

## 2023-12-31 ENCOUNTER — Ambulatory Visit (INDEPENDENT_AMBULATORY_CARE_PROVIDER_SITE_OTHER)

## 2023-12-31 DIAGNOSIS — E538 Deficiency of other specified B group vitamins: Secondary | ICD-10-CM | POA: Diagnosis not present

## 2023-12-31 MED ORDER — CYANOCOBALAMIN 1000 MCG/ML IJ SOLN
1000.0000 ug | Freq: Once | INTRAMUSCULAR | Status: AC
Start: 2023-12-31 — End: 2023-12-31
  Administered 2023-12-31: 1000 ug via INTRAMUSCULAR

## 2023-12-31 NOTE — Progress Notes (Signed)
Pt here for monthly B12 injection per Dr.Kuneff   B12 1000mcg given IM, and pt tolerated injection well.   Next B12 injection scheduled for 1 month.       

## 2024-01-30 ENCOUNTER — Ambulatory Visit (INDEPENDENT_AMBULATORY_CARE_PROVIDER_SITE_OTHER)

## 2024-01-30 ENCOUNTER — Ambulatory Visit

## 2024-01-30 DIAGNOSIS — E538 Deficiency of other specified B group vitamins: Secondary | ICD-10-CM

## 2024-01-30 MED ORDER — CYANOCOBALAMIN 1000 MCG/ML IJ SOLN
1000.0000 ug | Freq: Once | INTRAMUSCULAR | Status: AC
  Administered 2024-01-30: 1000 ug via INTRAMUSCULAR

## 2024-01-30 NOTE — Progress Notes (Signed)
Pt here for monthly B12 injection per Dr.Kuneff   B12 1000mcg given IM, and pt tolerated injection well.   Next B12 injection scheduled for 1 month.       

## 2024-02-27 ENCOUNTER — Ambulatory Visit

## 2024-03-26 ENCOUNTER — Ambulatory Visit

## 2024-03-26 DIAGNOSIS — E538 Deficiency of other specified B group vitamins: Secondary | ICD-10-CM | POA: Diagnosis not present

## 2024-03-26 MED ORDER — CYANOCOBALAMIN 1000 MCG/ML IJ SOLN
1000.0000 ug | INTRAMUSCULAR | Status: AC
  Administered 2024-03-26 – 2024-09-15 (×6): 1000 ug via INTRAMUSCULAR

## 2024-03-26 NOTE — Progress Notes (Signed)
 Pt here for monthly B12 injection per Dr Catherine  Last B12 injection: 02/27/24  B12 1000mcg given IM, and pt tolerated injection well.  Next B12 injection scheduled for: 1 month

## 2024-04-23 ENCOUNTER — Ambulatory Visit

## 2024-04-23 DIAGNOSIS — E538 Deficiency of other specified B group vitamins: Secondary | ICD-10-CM

## 2024-04-23 NOTE — Progress Notes (Signed)
 Pt here for monthly B12 injection per Dr Catherine   Last B12 injection: 03/26/24   B12 1000mcg given IM, and pt tolerated injection well.   Next B12 injection scheduled for: 1 month

## 2024-05-21 ENCOUNTER — Ambulatory Visit (INDEPENDENT_AMBULATORY_CARE_PROVIDER_SITE_OTHER)

## 2024-05-21 ENCOUNTER — Ambulatory Visit

## 2024-05-21 DIAGNOSIS — E538 Deficiency of other specified B group vitamins: Secondary | ICD-10-CM

## 2024-05-21 NOTE — Progress Notes (Signed)
 Pt here for monthly B12 injection per Dr. Catherine   Last B12 injection: 9/5  B12 1000mcg given IM, and pt tolerated injection well.  Next B12 injection scheduled for: 1 month

## 2024-06-18 ENCOUNTER — Ambulatory Visit (INDEPENDENT_AMBULATORY_CARE_PROVIDER_SITE_OTHER)

## 2024-06-18 DIAGNOSIS — E538 Deficiency of other specified B group vitamins: Secondary | ICD-10-CM | POA: Diagnosis not present

## 2024-06-18 NOTE — Progress Notes (Signed)
 Pt here for monthly B12 injection per Dr. Catherine    Last B12 injection: 10/03   B12 1000mcg given IM, and pt tolerated injection well.   Next B12 injection scheduled for: 1 month

## 2024-07-14 ENCOUNTER — Ambulatory Visit

## 2024-07-14 DIAGNOSIS — E538 Deficiency of other specified B group vitamins: Secondary | ICD-10-CM

## 2024-07-14 NOTE — Progress Notes (Signed)
 Pt here for monthly B12 injection per original order dated: 07/17/22  Last B12 injection: 06/18/24  Last B12 level:  07/16/22  B12 1000mcg given IM left deltoid, and pt tolerated injection well.  Next B12 injection scheduled for: 08/11/24

## 2024-08-11 ENCOUNTER — Ambulatory Visit

## 2024-08-11 DIAGNOSIS — E538 Deficiency of other specified B group vitamins: Secondary | ICD-10-CM

## 2024-08-11 MED ORDER — CYANOCOBALAMIN 1000 MCG/ML IJ SOLN
1000.0000 ug | Freq: Once | INTRAMUSCULAR | Status: AC
  Administered 2024-08-11: 1000 ug via INTRAMUSCULAR

## 2024-08-11 NOTE — Progress Notes (Signed)
 Pt here for monthly B12 injection.  B12 1000mcg given IM, and pt tolerated injection well.  Next B12 injection scheduled for 1 month

## 2024-09-10 ENCOUNTER — Ambulatory Visit

## 2024-09-15 ENCOUNTER — Ambulatory Visit

## 2024-09-15 DIAGNOSIS — E538 Deficiency of other specified B group vitamins: Secondary | ICD-10-CM | POA: Diagnosis not present

## 2024-09-15 NOTE — Progress Notes (Signed)
 Pt here for monthly B12 injection.  B12 1000mcg given IM, and pt tolerated injection well.  Next B12 injection scheduled for 1 month

## 2024-09-16 ENCOUNTER — Ambulatory Visit

## 2024-10-13 ENCOUNTER — Ambulatory Visit
